# Patient Record
Sex: Female | Born: 1935 | Race: White | Hispanic: No | Marital: Married | Smoking: Never smoker
Health system: Southern US, Community
[De-identification: ages and names within clinical notes are randomized; demographics above are authoritative.]

## PROBLEM LIST (undated history)

## (undated) DIAGNOSIS — I4891 Unspecified atrial fibrillation: Secondary | ICD-10-CM

## (undated) DIAGNOSIS — E785 Hyperlipidemia, unspecified: Secondary | ICD-10-CM

## (undated) DIAGNOSIS — I10 Essential (primary) hypertension: Secondary | ICD-10-CM

## (undated) DIAGNOSIS — R42 Dizziness and giddiness: Secondary | ICD-10-CM

## (undated) DIAGNOSIS — F0390 Unspecified dementia without behavioral disturbance: Secondary | ICD-10-CM

## (undated) DIAGNOSIS — T7840XA Allergy, unspecified, initial encounter: Secondary | ICD-10-CM

## (undated) DIAGNOSIS — R413 Other amnesia: Secondary | ICD-10-CM

## (undated) HISTORY — DX: Other amnesia: R41.3

## (undated) HISTORY — DX: Allergy, unspecified, initial encounter: T78.40XA

## (undated) HISTORY — PX: RETINAL DETACHMENT SURGERY: SHX105

## (undated) HISTORY — DX: Unspecified atrial fibrillation: I48.91

## (undated) HISTORY — DX: Essential (primary) hypertension: I10

## (undated) HISTORY — DX: Hyperlipidemia, unspecified: E78.5

---

## 1972-09-26 HISTORY — PX: APPENDECTOMY: SHX54

## 2000-04-24 ENCOUNTER — Emergency Department (HOSPITAL_COMMUNITY): Admission: EM | Admit: 2000-04-24 | Discharge: 2000-04-24 | Payer: Self-pay | Admitting: Emergency Medicine

## 2000-04-25 ENCOUNTER — Ambulatory Visit (HOSPITAL_COMMUNITY): Admission: RE | Admit: 2000-04-25 | Discharge: 2000-04-25 | Payer: Self-pay | Admitting: Emergency Medicine

## 2001-07-19 ENCOUNTER — Other Ambulatory Visit: Admission: RE | Admit: 2001-07-19 | Discharge: 2001-07-19 | Payer: Self-pay | Admitting: Gynecology

## 2002-12-26 ENCOUNTER — Encounter (INDEPENDENT_AMBULATORY_CARE_PROVIDER_SITE_OTHER): Payer: Self-pay | Admitting: *Deleted

## 2005-08-15 ENCOUNTER — Other Ambulatory Visit: Admission: RE | Admit: 2005-08-15 | Discharge: 2005-08-15 | Payer: Self-pay | Admitting: Gynecology

## 2005-09-06 ENCOUNTER — Encounter (INDEPENDENT_AMBULATORY_CARE_PROVIDER_SITE_OTHER): Payer: Self-pay | Admitting: Specialist

## 2005-09-06 ENCOUNTER — Ambulatory Visit (HOSPITAL_COMMUNITY): Admission: RE | Admit: 2005-09-06 | Discharge: 2005-09-06 | Payer: Self-pay | Admitting: Orthopedic Surgery

## 2005-09-06 ENCOUNTER — Ambulatory Visit (HOSPITAL_BASED_OUTPATIENT_CLINIC_OR_DEPARTMENT_OTHER): Admission: RE | Admit: 2005-09-06 | Discharge: 2005-09-06 | Payer: Self-pay | Admitting: Orthopedic Surgery

## 2009-10-03 ENCOUNTER — Emergency Department (HOSPITAL_COMMUNITY): Admission: EM | Admit: 2009-10-03 | Discharge: 2009-10-03 | Payer: Self-pay | Admitting: Emergency Medicine

## 2009-10-05 ENCOUNTER — Telehealth: Payer: Self-pay | Admitting: Physician Assistant

## 2009-10-07 ENCOUNTER — Ambulatory Visit: Payer: Self-pay | Admitting: Physician Assistant

## 2009-10-07 ENCOUNTER — Ambulatory Visit: Payer: Self-pay | Admitting: Internal Medicine

## 2009-10-07 ENCOUNTER — Encounter: Payer: Self-pay | Admitting: Gastroenterology

## 2009-10-07 DIAGNOSIS — K5730 Diverticulosis of large intestine without perforation or abscess without bleeding: Secondary | ICD-10-CM | POA: Insufficient documentation

## 2009-10-07 DIAGNOSIS — K5732 Diverticulitis of large intestine without perforation or abscess without bleeding: Secondary | ICD-10-CM | POA: Insufficient documentation

## 2009-10-07 DIAGNOSIS — R197 Diarrhea, unspecified: Secondary | ICD-10-CM | POA: Insufficient documentation

## 2009-10-07 DIAGNOSIS — I10 Essential (primary) hypertension: Secondary | ICD-10-CM | POA: Insufficient documentation

## 2009-10-07 DIAGNOSIS — K625 Hemorrhage of anus and rectum: Secondary | ICD-10-CM | POA: Insufficient documentation

## 2009-10-07 DIAGNOSIS — K219 Gastro-esophageal reflux disease without esophagitis: Secondary | ICD-10-CM | POA: Insufficient documentation

## 2009-10-07 DIAGNOSIS — A09 Infectious gastroenteritis and colitis, unspecified: Secondary | ICD-10-CM | POA: Insufficient documentation

## 2009-10-07 DIAGNOSIS — E785 Hyperlipidemia, unspecified: Secondary | ICD-10-CM | POA: Insufficient documentation

## 2009-10-07 DIAGNOSIS — R110 Nausea: Secondary | ICD-10-CM | POA: Insufficient documentation

## 2009-10-07 LAB — CONVERTED CEMR LAB
Basophils Relative: 0.5 % (ref 0.0–3.0)
Eosinophils Absolute: 0.3 10*3/uL (ref 0.0–0.7)
Eosinophils Relative: 4.7 % (ref 0.0–5.0)
Lymphocytes Relative: 36.5 % (ref 12.0–46.0)
MCV: 90 fL (ref 78.0–100.0)
Monocytes Absolute: 0.3 10*3/uL (ref 0.1–1.0)
Monocytes Relative: 6 % (ref 3.0–12.0)
Neutro Abs: 3 10*3/uL (ref 1.4–7.7)
RBC: 4.95 M/uL (ref 3.87–5.11)

## 2009-11-04 ENCOUNTER — Ambulatory Visit: Payer: Self-pay | Admitting: Gastroenterology

## 2009-11-06 ENCOUNTER — Encounter: Payer: Self-pay | Admitting: Gastroenterology

## 2010-04-29 ENCOUNTER — Encounter: Admission: RE | Admit: 2010-04-29 | Discharge: 2010-04-29 | Payer: Self-pay | Admitting: Gynecology

## 2010-09-26 HISTORY — PX: CHOLECYSTECTOMY: SHX55

## 2010-10-28 NOTE — Letter (Signed)
Summary: [facility] Instructions  [facility]  [address] [place], [place] [address]   Phone: [phone]  Fax: [phone]       Savannah Hinton    [birth year]    MRN: [id number]       Procedure Day /Date:11-04-09     Arrival Time: 8:00 AM     Procedure Time:_9:00 AM     Location of Procedure:                    X     [facility] (4th Floor)    PREPARATION FOR COLONOSCOPY WITH MIRALAX  Starting 5 days prior to your procedure 10-30-09 do not eat nuts, seeds, popcorn, corn, beans, peas,  salads, or any raw vegetables.  Do not take any fiber supplements (e.g. Metamucil, Citrucel, and Benefiber). ____________________________________________________________________________________________________   THE DAY BEFORE YOUR PROCEDURE         DATE: 11-03-09 DAY: Tuesday  1   Drink clear liquids the entire day-NO SOLID FOOD  2   Do not drink anything colored red or purple.  Avoid juices with pulp.  No orange juice.  3   Drink at least 64 oz. (8 glasses) of fluid/clear liquids during the day to prevent dehydration and help the prep work efficiently.  CLEAR LIQUIDS INCLUDE: Water Jello Ice Popsicles Tea (sugar ok, no milk/cream) Powdered fruit flavored drinks Coffee (sugar ok, no milk/cream) Gatorade Juice: apple, white grape, white cranberry  Lemonade Clear bullion, consomm, broth Carbonated beverages (any kind) Strained chicken noodle soup Hard Candy  4   Mix the entire bottle of Miralax with 64 oz. of Gatorade/Powerade in the morning and put in the refrigerator to chill.  5   At 3:00 pm take 2 Dulcolax/Bisacodyl tablets.  6   At 4:30 pm take one Reglan/Metoclopramide tablet.  7  Starting at 5:00 pm drink one 8 oz glass of the Miralax mixture every 15-20 minutes until you have finished drinking the entire 64 oz.  You should finish drinking prep around 7:30 or 8:00 pm.  8   If you are nauseated, you may take the 2nd Reglan/Metoclopramide tablet at 6:30  pm.        9    At 8:00 pm take 2 more DULCOLAX/Bisacodyl tablets.     THE DAY OF YOUR PROCEDURE      DATE: 11-04-09 DAY: Wednesday  You may drink clear liquids until 7:00 AM  (2 HOURS BEFORE PROCEDURE).   MEDICATION INSTRUCTIONS  Unless otherwise instructed, you should take regular prescription medications with a small sip of water as early as possible the morning of your procedure.         OTHER INSTRUCTIONS  You will need a responsible adult at least 74 years of age to accompany you and drive you home.   This person must remain in the waiting room during your procedure.  Wear loose fitting clothing that is easily removed.  Leave jewelry and other valuables at home.  However, you may wish to bring a book to read or an iPod/MP3 player to listen to music as you wait for your procedure to start.  Remove all body piercing jewelry and leave at home.  Total time from sign-in until discharge is approximately 2-3 hours.  You should go home directly after your procedure and rest.  You can resume normal activities the day after your procedure.  The day of your procedure you should not:   Drive   Make legal decisions  Operate machinery   Drink alcohol   Return to work  You will receive specific instructions about eating, activities and medications before you leave.   The above instructions have been reviewed and explained to me by   _______________________    I fully understand and can verbalize these instructions _____________________________ Date _______

## 2010-10-28 NOTE — Assessment & Plan Note (Signed)
Summary: [facility] ,bloody diarrhea/PP   History of Present Illness Visit Type: new patient  Primary GI MD: Stan Head MD [facility] Primary Provider: Marinda Elk, MD  Requesting Provider: n/a Chief Complaint: Hosp f/u for BRB in stools. Pt states that she is better and there is no more blood in stool. History of Present Illness:   76 YO FEMALE KNOWN TO DR SAM [place] ,LAST SEEN IN 2004 FOR COLONOSCOPY WHICH SHOWED DIVERTICULOSIS/NO POLYPS. SHE WAS SEEN THIS PAST WEEKEND BY MYSELF IN THE E.R. AT [facility]. SHE PRESENTED WITH ONE DAY HX OF DIARRHEA,SEVERE ABDOMINAL CRAMPING ,NAUSEA AND MULTIPLE DIARRHEAL STOOLS. SHE NOTED BRB MIXED WITH THE BMS. NO FEVER.LABS IN THE ER WERE NORMAL. ON EXAM SHE WAS NONTOXIS AND STOOL SPECIMEN SHE BROUGHT WITH HER WAS LIQUID BROWN TINGED WITH BRB. SHE IS MUCH IMPROVED AT THIS TIME. NO FURTHER BLEEDING. ABDOMINALCRAMPING HAS RESOLVED,SHE IS EATING WITHOUT PAIN OR NAUSEA. SHE REPORTS HER HUSBAND IS SICK TODAY WITH DIARRHEA AND CRAMPING.   GI Review of Systems    Reports acid reflux, nausea, and  vomiting.      Denies abdominal pain, belching, bloating, chest pain, dysphagia with liquids, dysphagia with solids, heartburn, loss of appetite, vomiting blood, and  weight loss.      Reports diarrhea, diverticulosis, and  rectal bleeding.     Denies anal fissure, black tarry stools, change in bowel habit, constipation, heme positive stool, hemorrhoids, irritable bowel syndrome, jaundice, light color stool, liver problems, and  rectal pain.    Current Medications (verified): 1)  Zestoretic 20-25 Mg Tabs (Lisinopril-Hydrochlorothiazide) .... One Tablet By Mouth Once Daily 2)  Crestor 10 Mg Tabs (Rosuvastatin Calcium) .... One Tablet By Mouth Once Daily 3)  Diazepam 5 Mg Tabs (Diazepam) .... One Tablet By Mouth Three Times A Day As Needed 4)  Aspirin 81 Mg  Tabs (Aspirin) .... One Tablet By Mouth Once Daily 5)  Nexium 40 Mg Cpdr (Esomeprazole Magnesium) .... One Tablet By  Mouth Once Daily As Needed 6)  Vitamin E 400 Unit Caps (Vitamin E) .... One Tablet By Mouth Once Daily 7)  Centrum Silver  Tabs (Multiple Vitamins-Minerals) .... One Tablet By Mouth Once Daily 8)  Clarinex 5 Mg Tabs (Desloratadine) .... One Tablet By Mouth As Needed 9)  Allergy Shot(Dosage Unknown) .... Once A Week 10)  Tylenol Extra Strength 500 Mg Tabs (Acetaminophen) .... As Needed For Pain 11)  Dicyclomine Hcl 10 Mg Caps (Dicyclomine Hcl) .... One Capsule By Mouth 1/2 Hour Before Each Meal For Cramping  Allergies (verified): 1)  ! Penicillin  Past History:  Past Medical History: Anal Fissure--1979 Glaucoma Hyperlipidemia Hypertension Urinary Tract Infection DIVERTICULOSIS  Past Surgical History: Appendectomy Hysterectomy  Family History: No FH of Colon Cancer: Family History of Breast Cancer:Sister   Social History: Occupation: Retired Married one child Patient has never smoked.  Alcohol Use - no Illicit Drug Use - no Smoking Status:  never Drug Use:  no  Review of Systems       The patient complains of allergy/sinus.  The patient denies anemia, anxiety-new, arthritis/joint pain, back pain, blood in urine, breast changes/lumps, change in vision, confusion, cough, coughing up blood, depression-new, fainting, fatigue, fever, headaches-new, hearing problems, heart murmur, heart rhythm changes, itching, menstrual pain, muscle pains/cramps, night sweats, nosebleeds, pregnancy symptoms, shortness of breath, skin rash, sleeping problems, sore throat, swelling of feet/legs, swollen lymph glands, thirst - excessive , urination - excessive , urination changes/pain, urine leakage, vision changes, and voice change.  ROS OTHERWISE AS IN HPI  Vital Signs:  Patient profile:   76 year old female Height:      65 inches Weight:      202 pounds BMI:     33.74 BSA:     1.99 Pulse rate:   64 / minute Pulse rhythm:   regular BP sitting:   124 / 76  (left arm) Cuff size:    regular  Vitals Entered By: Ok Anis CMA (October 07, 2009 10:28 AM)  Physical Exam  General:  Well developed, well nourished, no acute distress. Head:  Normocephalic and atraumatic. Eyes:  PERRLA, no icterus. Neck:  Supple; no masses or thyromegaly. Lungs:  Clear throughout to auscultation. Heart:  Regular rate and rhythm; no murmurs, rubs,  or bruits. Abdomen:  SOFT, NONTENDER, NO MASS OR HSM,BS+ Rectal:  NOT DONE Extremities:  No clubbing, cyanosis, edema or deformities noted. Neurologic:  Alert and  oriented x4;  grossly normal neurologically. Psych:  Alert and cooperative. Normal mood and affect.   Impression & Recommendations:  Problem # 1:  DIARRHEA-PRESUMED INFECTIOUS (ICD-009.3) Assessment New 76 YO FEMALE WITH RESOLVED INFECTOIUS GASTROENTERITIS  LABS DONE TODAY IN THE OFFICE ;WBC5.7,HGB 14.6.  ADVANCE DIET AS TOLERATED  Problem # 2:  RECTAL BLEEDING (ICD-569.3) Assessment: New  HEMATOCHEZIA ASSOCIATED WITH ABOVE -PROBABLY SECONDARY TO ASSOCIATED COLITIS,HOWEVER LAST COLONOSCOPY WAS IN 2004,CANNOT R/O OTHER SOURCE/LESION.  DISCUSSED FOLLOW UP COLONOSOCPY AND PT WOULD LIKE TO PROCEED, HER HUSBAND SEES DR PATTERSON AND SHE WOULD LIKE TO ESTABLISH WITH HIM. SCHEDULE COLONOSCOPY WITH DR Sandria Bales DISCUSSED IN DETAIL WITH PT.  ADVISED TO CALL IN THE INTERIM IF ANY RECURRENT BLEEDING.  Orders: Colonoscopy (Colon)  Problem # 3:  DIVERTICULOSIS-COLON (ICD-562.10) Assessment: Comment Only  Problem # 4:  HYPERLIPIDEMIA (ICD-272.4) Assessment: Comment Only  Problem # 5:  HYPERTENSION (ICD-401.9) Assessment: Comment Only  Patient Instructions: 1)  We scheduled the colonoscopy with Dr. Jarold Motto for 11-04-09 at 9:00 AM. 2)  We sent the colonoscopy prep to your pharmacy [facility]. 3)  Copy sent to : Dr. Marinda Elk Prescriptions: DULCOLAX 5 MG  TBEC (BISACODYL) Day before procedure take 2 at 3pm and 2 at 8pm.  #4 x 0   Entered by:   Lowry Ram  NCMA   Authorized by:   Sammuel Cooper PA-c   Signed by:   Lowry Ram NCMA on 10/07/2009   Method used:   [facility]ically to        [facility]  [address] [phone][facility] (retail)       [address] [place], [place]  [address]       Ph: [phone] or [phone]       Fax: [phone]   RxID:   [id number] METOCLOPRAMIDE HCL 10 MG  TABS (METOCLOPRAMIDE HCL) As per prep instructions.  #2 x 0   Entered by:   Lowry Ram NCMA   Authorized by:   Sammuel Cooper PA-c   Signed by:   Lowry Ram NCMA on 10/07/2009   Method used:   [facility]        [facility]  [address] [phone][facility] (retail)       [address] [place], [place]  [address]       Ph: [phone] or [phone]       Fax: [phone]   RxID:  [id number] MIRALAX   POWD (POLYETHYLENE GLYCOL 3350) As per prep  instructions.  #255gm x 0   Entered by:   Lowry Ram NCMA   Authorized by:   Sammuel Cooper PA-c   Signed by:   Lowry Ram NCMA on 10/07/2009   Method used:   [facility]        [facility]  [address] [phone][facility] (retail)       [address] [place], [place]  [address]       Ph: [phone] or [phone]       Fax: [phone]   RxID:   [id number]

## 2010-10-28 NOTE — Procedures (Signed)
Summary: [facility] COLON   Colonoscopy  Procedure date:  12/26/2002  Findings:      Location:  [facility].   Patient Name: Savannah Hinton, Savannah Hinton MRN:  Procedure Procedures: Colonoscopy CPT: [phone].  Personnel: Endoscopist: Ulyess Mort, MD.  Referred By: Marinda Elk, MD. Sharon Mt, MD. Vianne Bulls, MD. Miguel Aschoff, MD.  Exam Location: Exam performed in Outpatient Clinic. Outpatient  Patient Consent: Procedure, Alternatives, Risks and Benefits discussed, consent obtained, from patient. Consent was obtained by the RN.  Indications  Average Risk Screening Routine.  History  Pre-Exam Physical: Performed Dec 26, 2002. Cardio-pulmonary exam, Rectal exam, HEENT exam , Abdominal exam, Extremity exam, Mental status exam WNL.  Exam Exam: Extent of exam reached: Cecum, extent intended: Cecum.  The cecum was identified by appendiceal orifice and IC valve. Colon retroflexion performed. Images were not taken. ASA Classification: II. Tolerance: good.  Monitoring: Pulse and BP monitoring, Oximetry used. Supplemental O2 given.  Colon Prep Prep results: good.  Sedation Meds: Patient assessed and found to be appropriate for moderate (conscious) sedation. Fentanyl given IV. Versed given IV.  Findings - DIVERTICULOSIS: Descending Colon to Sigmoid Colon. ICD9: Diverticulosis: 562.10. Comments: mild.  - NOT SEEN ON EXAM: Cecum to Rectum. Polyps, AVM's, Colitis, Tumors, Melanosis, Crohn's, Hemorrhoids,   Assessment Abnormal examination, see findings above.  Diagnoses: 562.10: Diverticulosis.   Events  Unplanned Interventions: No intervention was required.  Unplanned Events: There were no complications. Plans Medication Plan: Continue current medications.  Patient Education: Patient given standard instructions for: Diverticulosis. Yearly hemoccult testing recommended. Patient instructed to get routine colonoscopy every 8 years.  Disposition: After  procedure patient sent to recovery. After recovery patient sent home.   cc: Marinda Elk, MD     Sharon Mt, MD     C. Duane Lope, MD     Duane Lope, MD  This report was created from the original endoscopy report, which was reviewed and signed by the above listed endoscopist.

## 2010-10-28 NOTE — Procedures (Signed)
Summary: Colonoscopy  Patient: [name] Note: All result statuses are Final unless otherwise noted.  Tests: (1) Colonoscopy (COL)   COL Colonoscopy           DONE     [facility]     [address]     [place], [place]  [address]           COLONOSCOPY PROCEDURE REPORT           PATIENT:  Savannah Hinton, Savannah Hinton  MR#:  [id number]     BIRTHDATE:  06-29-1935, 74 yrs. old  GENDER:  female           ENDOSCOPIST:  Vania Rea. Jarold Motto, MD, [facility]     Referred by:           PROCEDURE DATE:  11/04/2009     PROCEDURE:  Colonoscopy with snare polypectomy     ASA CLASS:  Class II     INDICATIONS:  rectal bleeding           MEDICATIONS:   Fentanyl 75 mcg IV, Versed 8 mg IV           DESCRIPTION OF PROCEDURE:   After the risks benefits and     alternatives of the procedure were thoroughly explained, informed     consent was obtained.  Digital rectal exam was performed and     revealed no abnormalities.   The LB CF-[id number] endoscope     was introduced through the anus and advanced to the cecum, which     was identified by both the appendix and ileocecal valve, without     limitations.  The quality of the prep was adequate, using     MoviPrep.  The instrument was then slowly withdrawn as the colon     was fully examined.     <<PROCEDUREIMAGES>>           FINDINGS:  Moderate diverticulosis was found sigmoid to descending     A pedunculated polyp was found. 1.5CM THICK STALKED VASCULAR POLYP     SNARE EXCISED.  This was otherwise a normal examination of the     colon.   Retroflexed views in the rectum revealed no     abnormalities.    The scope was then withdrawn from the patient     and the procedure completed.           COMPLICATIONS:  None           ENDOSCOPIC IMPRESSION:     1) Moderate diverticulosis in the sigmoid to descending     2) Pedunculated polyp     3) Otherwise normal examination     NO COLITIS NOTED.     RECOMMENDATIONS:     1) high fiber diet     2) If  the polyp(s) removed today are proven to be adenomatous     (pre-cancerous) polyps, you will need a repeat colonoscopy in 5     years. Otherwise you should continue to follow colorectal cancer     screening guidelines for "routine risk" patients with colonoscopy     in 10 years.           REPEAT EXAM:  No           ______________________________     Vania Rea. Jarold Motto, MD, Clementeen Graham           CC:  Marinda Elk, MDEsterwood, Amy PA-C           n.  eSIGNED:   Vania Rea. Delmar Arriaga at 11/04/2009 09:32 AM           Pecolia Ades, [id number]  Note: An exclamation mark (!) indicates a result that was not dispersed into the flowsheet. Document Creation Date: 11/04/2009 10:18 AM _______________________________________________________________________  (1) Order result status: Final Collection or observation date-time: 11/04/2009 09:26 Requested date-time:  Receipt date-time:  Reported date-time:  Referring Physician:   Ordering Physician: Sheryn Bison [phone]) Specimen Source:  Source: Launa Grill Order Number: [id number] Lab site:   Appended Document: Colonoscopy     Procedures Next Due Date:    Colonoscopy: 10/2014

## 2010-10-28 NOTE — Letter (Signed)
Summary: Patient Notice- Polyp Results  [facility]  [address] [place], [place] [address]   Phone: [phone]  Fax: [phone]        November 06, 2009 MRN: [id number]    Savannah Hinton [address] [place], [place]  [address]    Dear [name],  I am pleased to inform you that the colon polyp(s) removed during your recent colonoscopy was (were) found to be benign (no cancer detected) upon pathologic examination.  I recommend you have a repeat colonoscopy examination in 5_ years to look for recurrent polyps, as having colon polyps increases your risk for having recurrent polyps or even colon cancer in the future.  Should you develop new or worsening symptoms of abdominal pain, bowel habit changes or bleeding from the rectum or bowels, please schedule an evaluation with either your primary care physician or with me.  Additional information/recommendations:  _X_ No further action with gastroenterology is needed at this time. Please      follow-up with your primary care physician for your other healthcare      needs.  __ Please call [phone] to schedule a return visit to review your      situation.  __ Please keep your follow-up visit as already scheduled.  __ Continue treatment plan as outlined the day of your exam.  Please call us if you are having persistent problems or have questions about your condition that have not been fully answered at this time.  Sincerely,  Mardella Layman MD [facility]  This letter has been electronically signed by your physician.  Appended Document: Patient Notice- Polyp Results Letter mailed 2.15.11

## 2010-10-28 NOTE — Progress Notes (Signed)
Summary: fyi  Phone Note Call from Patient Call back at Home Phone [phone]   Caller: Patient Call For: Savannah Hinton/LEABUER Reason for Call: Talk to Nurse Summary of Call: Patient was seen by Sulay Brymer on Saturday and was told to call us today to let her know how she was doing, pt states that she feels better but wants to know about f.u appts. Initial call taken by: Savannah Hinton,  October 05, 2009 8:47 AM  Follow-up for Phone Call        Spoke to pt and she is feeling better.  She has seen no blood since the hospital in her stools.  She has no abd pain.  I made her appt to have a CBC in [facility].  Pt will come to the lab Wed 10-07-09 and then come up stairs to our office for appt with Barton Want PA.  Her appt is for 11AM but should be here at 10:45 to fill out new paperwork.  Hasn't seen anyone here since Dr. Victorino Dike. Follow-up by: Savannah Hinton,  October 05, 2009 9:00 AM

## 2010-12-12 LAB — DIFFERENTIAL
Basophils Relative: 0 % (ref 0–1)
Eosinophils Absolute: 0.1 10*3/uL (ref 0.0–0.7)
Eosinophils Relative: 1 % (ref 0–5)
Monocytes Relative: 5 % (ref 3–12)
Neutro Abs: 11.4 10*3/uL — ABNORMAL HIGH (ref 1.7–7.7)

## 2010-12-12 LAB — COMPREHENSIVE METABOLIC PANEL
ALT: 17 U/L (ref 0–35)
Albumin: 4.2 g/dL (ref 3.5–5.2)
Alkaline Phosphatase: 65 U/L (ref 39–117)
Chloride: 104 mEq/L (ref 96–112)
Creatinine, Ser: 0.9 mg/dL (ref 0.4–1.2)
GFR calc non Af Amer: >60 mL/min (ref >60)
Potassium: 4 mEq/L (ref 3.5–5.1)
Total Protein: 7.5 g/dL (ref 6.0–8.3)

## 2010-12-12 LAB — APTT: aPTT: 25 seconds (ref 24–37)

## 2010-12-12 LAB — CBC
HCT: 45.3 % (ref 36.0–46.0)
MCHC: 33.3 g/dL (ref 30.0–36.0)
MCV: 88.9 fL (ref 78.0–100.0)
RDW: 13.3 % (ref 11.5–15.5)

## 2010-12-12 LAB — PROTIME-INR: Prothrombin Time: 13.2 seconds (ref 11.6–15.2)

## 2011-02-11 NOTE — Op Note (Signed)
NAMESABRYNA, Savannah Hinton             ACCOUNT NO.:  [id number]   MEDICAL RECORD NO.:  [id number]          PATIENT TYPE:  AMB   LOCATION:  DSC                          FACILITY:  [facility]   PHYSICIAN:  Katy Fitch. Sypher, M.D. DATE OF BIRTH:  Sep 07, 1935   DATE OF PROCEDURE:  09/06/2005  DATE OF DISCHARGE:                                 OPERATIVE REPORT   PREOPERATIVE DIAGNOSIS:  Enlarging mass, palmar surface of right small  finger middle phalangeal segment.   POSTOPERATIVE DIAGNOSIS:  Early Dupuytren's palmar fibromatosis with  extensive cord and nodule formation.   OPERATIONS:  Right small finger fasciectomy.   OPERATING SURGEON:  Katy Fitch. Sypher, M.D.   ASSISTANT:  Molly Maduro Dasnoit PA-C   ANESTHESIA:  2% lidocaine metacarpal head level-block of right small finger  supplemented by IV sedation, supervising anesthesiologist Dr. Ivin Booty.   INDICATIONS:  Savannah Hinton is a 76 year old woman referred through the  courtesy of Dr. Foy Guadalajara of [facility] for evaluation of a mass in the palmar  surface of her right small finger.   She had a history of a nodule developing over the P2 segment that was  painful when she would grasp firm objects.  She had no clinical contracture.   This continued to enlarge, causing alarm.   She brought this the attention of Dr. Foy Guadalajara, who subsequently referred her  for an upper extremity orthopedic consult.   After informed consent, she is brought to the operating room at this time.   PROCEDURE:  Savannah Hinton is brought to the operating room and placed  in the supine position on the operating table.   Following light sedation, the right arm was prepped with Betadine soap and  solution and sterilely draped.  A pneumatic tourniquet was applied to the  proximal right brachium.   Following exsanguination of the right arm with an Esmarch bandage, an  arterial tourniquet was inflated to 250 mmHg.   Lidocaine 2% was infiltrated to obtain a digital block at  the metacarpal  head level.   When anesthesia was satisfactory, a Brunner zigzag incision was fashioned  from the DIP flexion crease to the proximal finger flexion crease.  The  subcutaneous tissues were carefully divided, revealing what appeared to be  obvious Dupuytren's nodule overlying the A3 and C1 pulleys.  There were  significant proximal extensions with a retrovascular cord on the ulnar  aspect of the finger and involvement of the lateral fascial sheath in the  ulnar aspect of the finger and several pretendinous cords on the radial  aspect of the finger.   A meticulous resection of all pathologic fascia was accomplished and the  nodule was removed with circumferential dissection, while carefully  retracting the neurovascular bundles.   Bleeding points were electrocauterized with bipolar current, followed by  repair of the skin with interrupted sutures of 5-0 nylon.   A compressive dressing was applied with Xeroflo sterile gauze and Coban.   For aftercare Ms. Broers was advised to elevate her fingers.  She work on  early range of motion excises.  She will return to see [place] for follow-up  in  office in five to seven days.   She is provided a prescription for Darvocet-N 100 one p.o. q.4-6h. p.r.n.  pain, 20 tablets with no refills.      Katy Fitch Sypher, M.D.  Electronically Signed     RVS/MEDQ  D:  09/06/2005  T:  09/07/2005  Job:  [id number]   cc:   Molly Maduro L. Foy Guadalajara, M.D.  Fax: [phone]

## 2011-06-02 ENCOUNTER — Encounter (INDEPENDENT_AMBULATORY_CARE_PROVIDER_SITE_OTHER): Payer: Self-pay | Admitting: Surgery

## 2011-06-03 ENCOUNTER — Encounter (INDEPENDENT_AMBULATORY_CARE_PROVIDER_SITE_OTHER): Payer: Self-pay | Admitting: Surgery

## 2011-06-16 ENCOUNTER — Encounter (INDEPENDENT_AMBULATORY_CARE_PROVIDER_SITE_OTHER): Payer: Self-pay | Admitting: Surgery

## 2011-06-16 ENCOUNTER — Ambulatory Visit (INDEPENDENT_AMBULATORY_CARE_PROVIDER_SITE_OTHER): Payer: Medicare Other | Admitting: Surgery

## 2011-06-16 VITALS — BP 116/86 | HR 80 | Temp 98.2°F | Resp 20 | Ht 64.0 in | Wt 202.2 lb

## 2011-06-16 DIAGNOSIS — K8010 Calculus of gallbladder with chronic cholecystitis without obstruction: Secondary | ICD-10-CM | POA: Insufficient documentation

## 2011-06-16 NOTE — Progress Notes (Signed)
Chief Complaint  Patient presents with  . Cholelithiasis    HPI Savannah Hinton is a 76 y.o. female Who presents with several years of intermittent postprandial cramping and diarrhea. She has had some nausea but she has never been able to vomit. She has some right-sided abdominal discomfort associated with the symptoms. This does happen when eating greasy food. She brought this to the attention of Dr. Joni Reining who obtained a right upper quadrant abdominal ultrasound. This showed a 2.3 cm gallstone but no wall thickening. Common bile duct was normal.HPI  Past Medical History  Diagnosis Date  . Hypertension   . Allergy   . Hyperlipidemia   . Glaucoma     Past Surgical History  Procedure Date  . Retinal detachment surgery   . Appendectomy 1974    Family History  Problem Relation Age of Onset  . Cancer Sister     breast    Social History History  Substance Use Topics  . Smoking status: Never Smoker   . Smokeless tobacco: Never Used  . Alcohol Use: No    Allergies  Allergen Reactions  . Penicillins     Current Outpatient Prescriptions  Medication Sig Dispense Refill  . aspirin 81 MG tablet Take 81 mg by mouth daily.        . calcium carbonate (TUMS - DOSED IN MG ELEMENTAL CALCIUM) 500 MG chewable tablet Chew 1 tablet by mouth daily.        Marland Kitchen desloratadine (CLARINEX) 5 MG tablet Take 5 mg by mouth daily.        . diazepam (VALIUM) 5 MG tablet Take 5 mg by mouth every 6 (six) hours as needed.        Marland Kitchen lisinopril-hydrochlorothiazide (PRINZIDE,ZESTORETIC) 20-12.5 MG per tablet Take 1 tablet by mouth daily.        . timolol (BETIMOL) 0.5 % ophthalmic solution 1 drop 2 (two) times daily.        . Vitamin D, Ergocalciferol, (DRISDOL) 50000 UNITS CAPS Take 50,000 Units by mouth.        . vitamin E 400 UNIT capsule Take 400 Units by mouth daily.        . multivitamin-iron-minerals-folic acid (CENTRUM) chewable tablet Chew 1 tablet by mouth daily.        . rosuvastatin  (CRESTOR) 10 MG tablet Take 10 mg by mouth daily.          Review of Systems Review of Systems Positive for diarrhea, abdominal cramping, nausea Blood pressure 116/86, pulse 80, temperature 98.2 F (36.8 C), temperature source Temporal, resp. rate 20, height 5\' 4"  (1.626 m), weight 202 lb 3.2 oz (91.717 kg).  Physical Exam Physical Exam WDWN in NAD HEENT:  EOMI, sclera anicteric Neck:  No masses, no thyromegaly Lungs:  CTA bilaterally; normal respiratory effort CV:  Regular rate and rhythm; no murmurs Abd:  +bowel sounds, soft, mild RUQ tenderness; small healed burn above umbilicus Ext:  Well-perfused; no edema Skin:  Warm, dry; no sign of jaundice Data Reviewed RUQ U/S  Assessment    Chronic calculus cholecystitis    Plan    Laparoscopic cholecystectomy with intraoperative cholangiogram - I discussed the procedure in detail.  The patient was given Agricultural engineer.  We discussed the risks and benefits of a laparoscopic cholecystectomy including, but not limited to bleeding, infection, injury to surrounding structures such as the intestine or liver, bile leak, retained gallstones, need to convert to an open procedure, prolonged diarrhea, blood clots such as  DVT,  common bile duct injury, anesthesia risks, and possible need for additional procedures.  We discussed the typical post-operative recovery course.        Tameka Hoiland K. 06/16/2011, 10:31 AM

## 2011-06-16 NOTE — Patient Instructions (Signed)
We will schedule your surgery today. 

## 2011-07-06 ENCOUNTER — Other Ambulatory Visit (INDEPENDENT_AMBULATORY_CARE_PROVIDER_SITE_OTHER): Payer: Self-pay | Admitting: Surgery

## 2011-07-06 ENCOUNTER — Encounter (HOSPITAL_COMMUNITY): Payer: Medicare Other

## 2011-07-06 ENCOUNTER — Ambulatory Visit (HOSPITAL_COMMUNITY)
Admission: RE | Admit: 2011-07-06 | Discharge: 2011-07-06 | Disposition: A | Discharge status: Home / Self Care | Payer: Medicare Other | Source: Ambulatory Visit | Attending: Surgery | Admitting: Surgery

## 2011-07-06 DIAGNOSIS — Z01812 Encounter for preprocedural laboratory examination: Secondary | ICD-10-CM | POA: Insufficient documentation

## 2011-07-06 DIAGNOSIS — Z01810 Encounter for preprocedural cardiovascular examination: Secondary | ICD-10-CM | POA: Insufficient documentation

## 2011-07-06 DIAGNOSIS — E785 Hyperlipidemia, unspecified: Secondary | ICD-10-CM | POA: Insufficient documentation

## 2011-07-06 DIAGNOSIS — K811 Chronic cholecystitis: Secondary | ICD-10-CM

## 2011-07-06 DIAGNOSIS — Z01818 Encounter for other preprocedural examination: Secondary | ICD-10-CM | POA: Insufficient documentation

## 2011-07-06 DIAGNOSIS — I10 Essential (primary) hypertension: Secondary | ICD-10-CM | POA: Insufficient documentation

## 2011-07-06 LAB — CBC
MCHC: 33.3 g/dL (ref 30.0–36.0)
MCV: 87.9 fL (ref 78.0–100.0)
RBC: 4.88 MIL/uL (ref 3.87–5.11)
WBC: 7.1 10*3/uL (ref 4.0–10.5)

## 2011-07-06 LAB — BASIC METABOLIC PANEL
BUN: 19 mg/dL (ref 6–23)
Calcium: 9.5 mg/dL (ref 8.4–10.5)
Glucose, Bld: 93 mg/dL (ref 70–99)

## 2011-07-06 LAB — SURGICAL PCR SCREEN: Staphylococcus aureus: NEGATIVE

## 2011-07-06 NOTE — Progress Notes (Signed)
Quick Note:  This patient may proceed with surgery. All labs and CXR are OK for surgery ______

## 2011-07-12 ENCOUNTER — Other Ambulatory Visit (INDEPENDENT_AMBULATORY_CARE_PROVIDER_SITE_OTHER): Payer: Self-pay | Admitting: Surgery

## 2011-07-12 ENCOUNTER — Ambulatory Visit (HOSPITAL_COMMUNITY): Payer: Medicare Other

## 2011-07-12 ENCOUNTER — Ambulatory Visit (HOSPITAL_COMMUNITY)
Admission: RE | Admit: 2011-07-12 | Discharge: 2011-07-12 | Disposition: A | Discharge status: Home / Self Care | Payer: Medicare Other | Source: Ambulatory Visit | Attending: Surgery | Admitting: Surgery

## 2011-07-12 DIAGNOSIS — Z01810 Encounter for preprocedural cardiovascular examination: Secondary | ICD-10-CM | POA: Insufficient documentation

## 2011-07-12 DIAGNOSIS — K8010 Calculus of gallbladder with chronic cholecystitis without obstruction: Secondary | ICD-10-CM | POA: Insufficient documentation

## 2011-07-12 DIAGNOSIS — E669 Obesity, unspecified: Secondary | ICD-10-CM | POA: Insufficient documentation

## 2011-07-12 DIAGNOSIS — Z01812 Encounter for preprocedural laboratory examination: Secondary | ICD-10-CM | POA: Insufficient documentation

## 2011-07-12 DIAGNOSIS — Z01818 Encounter for other preprocedural examination: Secondary | ICD-10-CM | POA: Insufficient documentation

## 2011-07-12 DIAGNOSIS — I10 Essential (primary) hypertension: Secondary | ICD-10-CM | POA: Insufficient documentation

## 2011-07-13 NOTE — Op Note (Signed)
Savannah Hinton, DOMBEK NO.:  [id number]  MEDICAL RECORD NO.:  [id number]  LOCATION:  DAYL                         FACILITY:  [facility]  PHYSICIAN:  Wilmon Arms. Corliss Skains, M.D. DATE OF BIRTH:  [birth year]  DATE OF PROCEDURE:  07/12/2011 DATE OF DISCHARGE:                              OPERATIVE REPORT   PREOPERATIVE DIAGNOSIS:  Chronic calculus cholecystitis.  POSTOPERATIVE DIAGNOSIS:  Chronic calculus cholecystitis.  PROCEDURE:  Laparoscopic cholecystectomy with intraoperative cholangiogram.  SURGEON:  Wilmon Arms. Menelik Mcfarren, M.D.  ANESTHESIA:  General.  INDICATIONS:  This is a 76 year old female who presents with several years of intermittent postprandial abdominal cramping and diarrhea.  She has had some nausea, but no vomiting.  Ultrasound showed a 2.3 cm gallstone, but no sign of wall thickening.  Her liver function tests were normal.  She presents now for cholecystectomy.  DESCRIPTION OF PROCEDURE:  The patient was brought to the operating room and placed in a supine position on the operating table.  After adequate level of general anesthesia was obtained, the patient's abdomen was prepped with ChloraPrep and draped in sterile fashion.  Time-out was taken to ensure the proper patient and proper procedure.  We infiltrated the area below the umbilicus with 0.25% Marcaine with epinephrine.  A transverse incision was made.  Dissection was carried down the fascia. The fascia was incised vertically.  We entered the peritoneal cavity bluntly.  A stay suture of 0 Vicryl was placed around the fascial opening.  The Hasson cannula was inserted and secured to stay suture. Pneumoperitoneum was obtained by insufflating CO2 maintaining maximal pressure of 15 mmHg.  The laparoscope was inserted.  The patient was positioned in reverse Trendelenburg, tilted to her left.  An 11 mm port was placed in the subxiphoid position.  Two 5 mm ports were placed in the right upper quadrant.   The gallbladder was grasped with clamp and lifted.  There was some chronic thickening to the gallbladder, but no active inflammation.  We opened the peritoneum around the hilum of the gallbladder, dissected around the cystic duct and cystic artery.  We ligated the cystic duct with a clip distally.  A small opening was created on the cystic duct.  A Cook cholangiogram catheter was inserted through a stab incision, threaded in the cystic duct.  A cholangiogram was then obtained, which showed good flow proximally and distally in the biliary tree with no sign of filling defect.  Contrast flowed easily into the duodenum.  The catheter was removed and the cystic duct was ligated with clips and divided.  The cystic artery was ligated with clips and divided.  Cautery was then used to dissect the gallbladder free from the liver.  The gallbladder was placed in an Endocatch sac. We irrigated the gallbladder fossa thoroughly for hemostasis.  We then removed the gallbladder and Endocatch sac through the umbilical port site.  A pursestring suture was used to close the umbilical fascia.  We inspected again for hemostasis. Pneumoperitoneum was then released as we removed the trocars.  4-0 Monocryl was used to close the skin incisions.  Steri-Strips and clean dressings were applied.  All sponge, instrument, needle counts were correct.  The patient was  extubated and brought to the recovery room in stable condition.     Wilmon Arms. Corliss Skains, M.D.     MKT/MEDQ  D:  07/12/2011  T:  07/12/2011  Job:  [id number]  Electronically Signed by Manus Rudd M.D. on 07/13/2011 10:27:38 AM

## 2011-07-15 ENCOUNTER — Encounter (INDEPENDENT_AMBULATORY_CARE_PROVIDER_SITE_OTHER): Payer: Self-pay | Admitting: Surgery

## 2011-07-21 ENCOUNTER — Encounter (INDEPENDENT_AMBULATORY_CARE_PROVIDER_SITE_OTHER): Payer: Self-pay | Admitting: Surgery

## 2011-07-27 ENCOUNTER — Encounter (INDEPENDENT_AMBULATORY_CARE_PROVIDER_SITE_OTHER): Payer: Self-pay | Admitting: Surgery

## 2011-07-27 ENCOUNTER — Ambulatory Visit (INDEPENDENT_AMBULATORY_CARE_PROVIDER_SITE_OTHER): Payer: Medicare Other | Admitting: Surgery

## 2011-07-27 VITALS — BP 125/79 | HR 72 | Temp 97.2°F | Resp 15 | Ht 64.0 in | Wt 194.2 lb

## 2011-07-27 DIAGNOSIS — K8010 Calculus of gallbladder with chronic cholecystitis without obstruction: Secondary | ICD-10-CM

## 2011-07-27 NOTE — Patient Instructions (Signed)
You may resume full activity.  Follow-up as needed.

## 2011-07-27 NOTE — Progress Notes (Signed)
Status post laparoscopic cholecystectomy with intraoperative cholangiogram on 10/16. Her pathology showed chronic calculus cholecystitis. She is doing quite well. She has resumed a regular diet. Bowel movements are softer but she is not having diarrhea. Her incisions are healing well with no sign of infection. No abdominal pain. She never take any narcotic pain medication. She'll use Tylenol. She may resume full activity. Followup p.r.n.

## 2011-12-26 ENCOUNTER — Ambulatory Visit (HOSPITAL_BASED_OUTPATIENT_CLINIC_OR_DEPARTMENT_OTHER)
Admission: RE | Admit: 2011-12-26 | Discharge: 2011-12-26 | Disposition: A | Discharge status: Home / Self Care | Payer: Medicare Other | Source: Ambulatory Visit | Attending: Family Medicine | Admitting: Family Medicine

## 2011-12-26 ENCOUNTER — Encounter (HOSPITAL_BASED_OUTPATIENT_CLINIC_OR_DEPARTMENT_OTHER): Payer: Self-pay

## 2011-12-26 ENCOUNTER — Other Ambulatory Visit (HOSPITAL_BASED_OUTPATIENT_CLINIC_OR_DEPARTMENT_OTHER): Payer: Self-pay | Admitting: Family Medicine

## 2011-12-26 ENCOUNTER — Emergency Department (HOSPITAL_BASED_OUTPATIENT_CLINIC_OR_DEPARTMENT_OTHER)
Admission: EM | Admit: 2011-12-26 | Discharge: 2011-12-26 | Disposition: A | Discharge status: Home / Self Care | Payer: Medicare Other | Attending: Emergency Medicine | Admitting: Emergency Medicine

## 2011-12-26 DIAGNOSIS — R52 Pain, unspecified: Secondary | ICD-10-CM

## 2011-12-26 DIAGNOSIS — M79609 Pain in unspecified limb: Secondary | ICD-10-CM

## 2011-12-26 DIAGNOSIS — R609 Edema, unspecified: Secondary | ICD-10-CM | POA: Insufficient documentation

## 2011-12-26 DIAGNOSIS — M7989 Other specified soft tissue disorders: Secondary | ICD-10-CM

## 2011-12-26 DIAGNOSIS — I824Y9 Acute embolism and thrombosis of unspecified deep veins of unspecified proximal lower extremity: Secondary | ICD-10-CM

## 2011-12-26 DIAGNOSIS — I10 Essential (primary) hypertension: Secondary | ICD-10-CM | POA: Insufficient documentation

## 2011-12-26 DIAGNOSIS — I82409 Acute embolism and thrombosis of unspecified deep veins of unspecified lower extremity: Secondary | ICD-10-CM

## 2011-12-26 LAB — APTT: aPTT: 32 s (ref 24–37)

## 2011-12-26 LAB — BASIC METABOLIC PANEL
Chloride: 101 mEq/L (ref 96–112)
GFR calc Af Amer: 82 mL/min — ABNORMAL LOW (ref >90)
GFR calc non Af Amer: 71 mL/min — ABNORMAL LOW (ref >90)
Glucose, Bld: 110 mg/dL — ABNORMAL HIGH (ref 70–99)
Potassium: 3.5 mEq/L (ref 3.5–5.1)
Sodium: 138 mEq/L (ref 135–145)

## 2011-12-26 LAB — CBC
HCT: 38.1 % (ref 36.0–46.0)
Hemoglobin: 12.9 g/dL (ref 12.0–15.0)
MCH: 28.9 pg (ref 26.0–34.0)
MCHC: 33.9 g/dL (ref 30.0–36.0)
MCV: 85.2 fL (ref 78.0–100.0)
Platelets: 263 K/uL (ref 150–400)
RBC: 4.47 MIL/uL (ref 3.87–5.11)
RDW: 14.8 % (ref 11.5–15.5)
WBC: 9.4 10*3/uL (ref 4.0–10.5)

## 2011-12-26 LAB — BASIC METABOLIC PANEL WITH GFR
BUN: 23 mg/dL (ref 6–23)
CO2: 27 meq/L (ref 19–32)
Calcium: 9.5 mg/dL (ref 8.4–10.5)
Creatinine, Ser: 0.8 mg/dL (ref 0.50–1.10)

## 2011-12-26 LAB — PROTIME-INR
INR: 1 (ref 0.00–1.49)
Prothrombin Time: 13.4 seconds (ref 11.6–15.2)

## 2011-12-26 MED ORDER — RIVAROXABAN 15 MG PO TABS: 15.0000 mg | ORAL_TABLET | Freq: Two times a day (BID) | ORAL | Status: DC

## 2011-12-26 MED ORDER — ENOXAPARIN SODIUM 100 MG/ML ~~LOC~~ SOLN
1.0000 mg/kg | Freq: Once | SUBCUTANEOUS | Status: AC
  Administered 2011-12-26: 85 mg via SUBCUTANEOUS
  Filled 2011-12-26: qty 1

## 2011-12-26 NOTE — ED Provider Notes (Signed)
This chart was scribed for Savannah Hinton. Savannah Lamas, MD by Savannah Hinton. The patient was seen in room MH07/MH07 at 6:07 PM.  CSN: [id number]  Arrival date & time 12/26/11  1745   First MD Initiated Contact with Patient 12/26/11 1806      Chief Complaint  Patient presents with  . DVT    (Consider location/radiation/quality/duration/timing/severity/associated sxs/prior treatment) Patient is a 77 y.o. female presenting with leg pain. The history is provided by the patient and the spouse.  Leg Pain  The incident occurred more than 2 days ago. The incident occurred at home. There was no injury mechanism. The pain is present in the right ankle and right knee. The pain is moderate. The pain has been constant since onset. Pertinent negatives include no loss of motion and no loss of sensation. She reports no foreign bodies present. The symptoms are aggravated by nothing. She has tried acetaminophen for the symptoms. The treatment provided no relief.   Savannah Hinton is a 77 y.o. female who presents to the Emergency Department complaining of acute onset moderate DVT. Pt had swelling, soreness, and pain in right leg that started 5 days ago. No recent travel. Pt denies any chest pain or dyspnea. Had pneumonia 1st of March now resolved. Ultrasound of leg done today at Dr. Sharlett Iles office and was sent here for DVT.   Past Medical History  Diagnosis Date  . Hypertension   . Allergy   . Hyperlipidemia   . Glaucoma     Past Surgical History  Procedure Date  . Retinal detachment surgery   . Appendectomy 1974  . Cholecystectomy 2012    Family History  Problem Relation Age of Onset  . Cancer Sister     breast    History  Substance Use Topics  . Smoking status: Never Smoker   . Smokeless tobacco: Never Used  . Alcohol Use: No    OB History    Grav Para Term Preterm Abortions TAB SAB Ect Mult Living                  Review of Systems  Constitutional: Negative for fever and activity  change.  Respiratory: Negative for chest tightness and shortness of breath.   Cardiovascular: Positive for leg swelling. Negative for chest pain.  All other systems reviewed and are negative.    Allergies  Codeine and Penicillins  Home Medications   Current Outpatient Rx  Name Route Sig Dispense Refill  . ASPIRIN 81 MG PO TABS Oral Take 81 mg by mouth daily.      . IBUPROFEN 200 MG PO TABS Oral Take 400 mg by mouth every 6 (six) hours as needed. Patient used this medication for her leg pain.    Marland Kitchen LISINOPRIL-HYDROCHLOROTHIAZIDE 20-12.5 MG PO TABS Oral Take 1 tablet by mouth daily.      Marland Kitchen ROSUVASTATIN CALCIUM 10 MG PO TABS Oral Take 10 mg by mouth daily.      Marland Kitchen TIMOLOL HEMIHYDRATE 0.5 % OP SOLN  1 drop 2 (two) times daily.      . TROLAMINE SALICYLATE 10 % EX CREA Topical Apply 1 application topically as needed. Patient used this medication for her leg pain as well.    Marland Kitchen VITAMIN E 400 UNITS PO CAPS Oral Take 400 Units by mouth daily.      Marland Kitchen CALCIUM CARBONATE ANTACID 500 MG PO CHEW Oral Chew 1 tablet by mouth daily.      . DESLORATADINE 5 MG PO TABS Oral  Take 5 mg by mouth daily.      Marland Kitchen HYDROCODONE-ACETAMINOPHEN 5-500 MG PO TABS      . CENTRUM PO CHEW Oral Chew 1 tablet by mouth daily.      Marland Kitchen RIVAROXABAN 15 MG PO TABS Oral Take 1 tablet (15 mg total) by mouth 2 (two) times daily with a meal. 42 tablet 0  . TIMOLOL MALEATE 0.5 % OP SOLN      . VITAMIN D (ERGOCALCIFEROL) 50000 UNITS PO CAPS Oral Take 50,000 Units by mouth.        BP 158/80  Pulse 101  Temp(Src) 98.9 F (37.2 C) (Oral)  Resp 20  Ht 5\' 4"  (1.626 m)  Wt 192 lb (87.091 kg)  BMI 32.96 kg/m2  SpO2 99%  Physical Exam  Nursing note and vitals reviewed. Constitutional: She is oriented to person, place, and time. She appears well-developed and well-nourished.  HENT:  Head: Normocephalic and atraumatic.  Eyes: EOM are normal. Pupils are equal, round, and reactive to light.  Neck: Normal range of motion. Neck supple.    Cardiovascular: Normal rate.   Pulmonary/Chest: Effort normal. No respiratory distress.  Musculoskeletal: Normal range of motion. She exhibits edema. She exhibits no tenderness.       Erythematous lower right leg 2+edema of right ankle and foot 2+edema of right lower leg and knee  Neurological: She is alert and oriented to person, place, and time.  Skin: Skin is warm and dry.  Psychiatric: She has a normal mood and affect. Her behavior is normal. Judgment and thought content normal.    ED Course  Procedures (including critical care time) DIAGNOSTIC STUDIES: Oxygen Saturation is 99% on room air, normal by my interpretation.    COORDINATION OF CARE:  Medications  ibuprofen (ADVIL,MOTRIN) 200 MG tablet (not administered)  trolamine salicylate (ASPERCREME) 10 % cream (not administered)  enoxaparin (LOVENOX) injection 85 mg (not administered)  Rivaroxaban (XARELTO) 15 MG TABS tablet (not administered)      Labs Reviewed  BASIC METABOLIC PANEL - Abnormal; Notable for the following:    Glucose, Bld 110 (*)    GFR calc non Af Amer 71 (*)    GFR calc Af Amer 82 (*)    All other components within normal limits  CBC  APTT  PROTIME-INR   US Venous Img Lower Unilateral Right  12/26/2011  **ADDENDUM** CREATED: 12/26/2011 17:26:05  The above report is incorrect.  See the correct report below.  *RADIOLOGY REPORT*  Clinical Data: Right leg pain and swelling  RIGHT LOWER EXTREMITY VENOUS DUPLEX ULTRASOUND  Technique:  Gray-scale sonography with graded compression, as well as color Doppler and duplex ultrasound, were performed to evaluate the deep venous system of the lower extremity from the level of the common femoral vein through the popliteal and proximal calf veins. Spectral Doppler was utilized to evaluate flow at rest and with distal augmentation maneuvers.  Comparison:  None.  Findings: There is thrombus in the femoral vein compatible with DVT which appears acute.  There is  diminished  compressibility in the common femoral vein.  There is lack of compressibility and augmentation in the profunda and superficial femoral vein and popliteal vein.  Greater saphenous vein in the mid thigh also is noncompressible and may contain superficial thrombosis.  IMPRESSION: Deep venous thrombosis in the right femoral venous system.  There also is superficial venous thrombosis.  **END ADDENDUM** SIGNED BY: Dineen Kid. Chestine Spore, M.D.    12/26/2011  *RADIOLOGY REPORT*  Clinical Data: Right leg pain  and swelling  RIGHT LOWER EXTREMITY VENOUS DUPLEX ULTRASOUND  Technique:  Gray-scale sonography with graded compression, as well as color Doppler and duplex ultrasound were performed to evaluate the deep venous system of the lower extremity from the level of the common femoral vein through the popliteal and proximal calf veins. Spectral Doppler was utilized to evaluate flow at rest and with distal augmentation maneuvers.  Comparison:  None.  Findings:  Normal compressibility of the common femoral, superficial femoral, and popliteal veins is demonstrated, as well as the visualized proximal calf veins.  No filling defects to suggest DVT on grayscale or color Doppler imaging.  Doppler waveforms show normal direction of venous flow, normal respiratory phasicity and response to augmentation.  IMPRESSION: No evidence of lower extremity deep vein thrombosis. Original Report Authenticated By: Camelia Phenes, M.D.    1. DVT (deep venous thrombosis)       MDM  I personally performed the services described in this documentation, which was scribed in my presence. The recorded information has been reviewed and considered.   Pt with no CP, pleusriy, SOB.  RA sat is 98%.  Pt reports able to walk, tolerable pain with just ibuprofen.  I recommended taking tylenol instead.  Offered codeine or hydrocodone but pt doesn't think she needs that strong of medication.  Will f/u with Dr. Foy Guadalajara tomorrow.  I discussed pt with Dr. Foy Guadalajara as  well.         Savannah Hinton. Savannah Lamas, MD 12/26/11 1932

## 2011-12-26 NOTE — Discharge Instructions (Signed)
Deep Vein Thrombosis A deep vein thrombosis (DVT) is a blood clot (thrombus) that develops in a deep vein. A DVT is a clot in the deep, larger veins of the leg, arm, or pelvis. These are more dangerous than clots that might form in veins on the surface of the body. Deep vein thrombosis can lead to complications if the clot breaks off and travels in the bloodstream to the lungs. CAUSES Blood clots form in a vein for different reasons. Usually several things cause blood clots. They include:  The flow of blood slows down.   The inside of the vein is damaged in some way.   The person has a condition that makes blood clot more easily. These conditions may include:   Older age (especially over 75 years old).   Having a history of blood clots.   Having major or lengthy surgery. Hip surgery is particularly high-risk.   Breaking a hip or leg.   Sitting or lying still for a long time.   Cancer or cancer treatment.   Having a long, thin tube (catheter) placed inside a vein during a medical procedure.   Being overweight (obese).   Pregnancy and childbirth.   Medicines with estrogen.   Smoking.   Other circulation or heart problems.  SYMPTOMS When a clot forms, it can either partially or totally block the blood flow in that vein. Symptoms of a DVT can include:  Swelling of the leg or arm, especially if one side is much worse.   Warmth and redness of the leg or arm, especially if one side is much worse.   Pain in an arm or leg. If the clot is in the leg, symptoms may be more noticeable or worse when standing or walking.  If the blood clot travels to the lung, it may cause:  Shortness of breath.   Chest pain. The pain may be worsened by deep breaths.   Coughing up thick mucus (phlegm), possibly flecked with blood.  Anyone with these symptoms should get emergency medical treatment right away. Call your local emergency services (911 in [place] if you have these symptoms. DIAGNOSIS If  a DVT is suspected, your caregiver will take a full medical history. He or she will also perform a physical exam. Tests that also may be required include:  Studies of the clotting properties of the blood.   An ultrasound scan.   X-rays to show the flow of blood when special dye is injected into the veins (venography).   Studies of your lungs if you have any chest symptoms.  PREVENTION  Exercise the legs regularly. Take a brisk 30 minute walk every day.   Maintain a weight that is appropriate for your height.   Avoid sitting or lying in bed for long periods of time without moving your legs.   Women, particularly those over the age of 35, should consider the risks and benefits of taking estrogen medicines, including birth control pills.   Do not smoke, especially if you take estrogen medicines.   Long-distance travel can increase your risk. You should exercise your legs by walking or pumping the muscles every hour.   In hospital prevention:   Prevention may include medical and nonmedical measures.  TREATMENT  The most common treatment for DVT is blood thinning (anticoagulant) medicine, which reduces the blood's tendency to clot. Anticoagulants can stop new blood clots from forming and old ones from growing. They cannot dissolve existing clots. Your body does this by itself over time.   Anticoagulants can be given by mouth, by intravenous (IV) access, or by injection. Your caregiver will determine the best program for you.   Less commonly, clot-dissolving drugs (thrombolytics) are used to dissolve a DVT. They carry a high risk of bleeding, so they are used mainly in severe cases.   Very rarely, a blood clot in the leg needs to be removed surgically.   If you are unable to take anticoagulants, your caregiver may arrange for you to have a filter placed in a main vein in your belly (abdomen). This filter prevents clots from traveling to your lungs.  HOME CARE INSTRUCTIONS  Take all  medicines prescribed by your caregiver. Follow the directions carefully.   You will most likely continue taking anticoagulants after you leave the hospital. Your caregiver will advise you on the length of treatment (usually 3 to 6 months, sometimes for life).   Taking too much or too little of an anticoagulant is dangerous. While taking this type of medicine, you will need to have regular blood tests to be sure the dose is correct. The dose can change for many reasons. It is critically important that you take this medicine exactly as prescribed, and that you have blood tests exactly as directed.   Many foods can interfere with anticoagulants. These include foods high in vitamin K, such as spinach, kale, broccoli, cabbage, collard and turnip greens, Brussels sprouts, peas, cauliflower, seaweed, parsley, beef and pork liver, green tea, and soybean oil. Your caregiver should discuss limits on these foods with you or you should arrange a visit with a dietician to answer your questions.   Many medicines can interfere with anticoagulants. You must tell your caregiver about any and all medicines you take. This includes all vitamins and supplements. Be especially cautious with aspirin and anti-inflammatory medicines. Ask your caregiver before taking these.   Anticoagulants can have side effects, mostly excessive bruising or bleeding. You will need to hold pressure over cuts for longer than usual. Avoid alcoholic drinks or consume only very small amounts while taking this medicine.   If you are taking an anticoagulant:   Wear a medical alert bracelet.   Notify your dentist or other caregivers before procedures.   Avoid contact sports.   Ask your caregiver how soon you can go back to normal activities. Not being active can lead to new clots. Ask for a list of what you should and should not do.   Exercise your lower leg muscles. This is important while traveling.   You may need to wear compression  stockings. These are tight elastic stockings that apply pressure to the lower legs. This can help keep the blood in the legs from clotting.   If you are a smoker, you should quit.   Learn as much as you can about DVT.  SEEK MEDICAL CARE IF:  You have unusual bruising or any bleeding problems.   The swelling or pain in your affected arm or leg is not gradually improving.   You anticipate surgery or long-distance travel. You should get specific advice on DVT prevention.   You discover other family members with blood clots. This may require further testing for inherited diseases or conditions.  SEEK IMMEDIATE MEDICAL CARE IF:  You develop chest pain.   You develop severe shortness of breath.   You begin to cough up bloody mucus or phlegm (sputum).   You feel dizzy or faint.   You develop swelling or pain in the leg.   You have   breathing problems after traveling.  MAKE SURE YOU:  Understand these instructions.   Will watch your condition.   Will get help right away if you are not doing well or get worse.  Document Released: 09/12/2005 Document Revised: 09/01/2011 Document Reviewed: 11/04/2010 [employer] Patient Information 2012 [employer]. 

## 2011-12-26 NOTE — ED Notes (Signed)
Pt states that Dr Foy Guadalajara sent her for [place] of RLE for possible dvt, pt was told to come to ED after procedure.

## 2012-02-17 ENCOUNTER — Other Ambulatory Visit: Payer: Self-pay | Admitting: Gynecology

## 2012-02-17 DIAGNOSIS — R928 Other abnormal and inconclusive findings on diagnostic imaging of breast: Secondary | ICD-10-CM

## 2012-02-27 ENCOUNTER — Ambulatory Visit
Admission: RE | Admit: 2012-02-27 | Discharge: 2012-02-27 | Disposition: A | Discharge status: Home / Self Care | Payer: Medicare Other | Source: Ambulatory Visit | Attending: Gynecology | Admitting: Gynecology

## 2012-02-27 DIAGNOSIS — R928 Other abnormal and inconclusive findings on diagnostic imaging of breast: Secondary | ICD-10-CM

## 2012-07-26 ENCOUNTER — Other Ambulatory Visit: Payer: Self-pay | Admitting: Family Medicine

## 2012-07-26 DIAGNOSIS — N6009 Solitary cyst of unspecified breast: Secondary | ICD-10-CM

## 2012-09-03 ENCOUNTER — Ambulatory Visit
Admission: RE | Admit: 2012-09-03 | Discharge: 2012-09-03 | Disposition: A | Discharge status: Home / Self Care | Payer: Medicare Other | Source: Ambulatory Visit | Attending: Family Medicine | Admitting: Family Medicine

## 2012-09-03 DIAGNOSIS — N6009 Solitary cyst of unspecified breast: Secondary | ICD-10-CM

## 2013-01-08 ENCOUNTER — Other Ambulatory Visit: Payer: Self-pay

## 2013-01-08 DIAGNOSIS — Z1231 Encounter for screening mammogram for malignant neoplasm of breast: Secondary | ICD-10-CM

## 2013-02-25 ENCOUNTER — Ambulatory Visit
Admission: RE | Admit: 2013-02-25 | Discharge: 2013-02-25 | Disposition: A | Discharge status: Home / Self Care | Payer: Medicare Other | Source: Ambulatory Visit

## 2013-02-25 DIAGNOSIS — Z1231 Encounter for screening mammogram for malignant neoplasm of breast: Secondary | ICD-10-CM

## 2013-07-11 ENCOUNTER — Ambulatory Visit (INDEPENDENT_AMBULATORY_CARE_PROVIDER_SITE_OTHER): Payer: Medicare Other | Admitting: Neurology

## 2013-07-11 ENCOUNTER — Encounter: Payer: Self-pay | Admitting: Neurology

## 2013-07-11 VITALS — BP 165/92 | HR 97 | Ht 65.0 in | Wt 197.0 lb

## 2013-07-11 DIAGNOSIS — I10 Essential (primary) hypertension: Secondary | ICD-10-CM

## 2013-07-11 DIAGNOSIS — E785 Hyperlipidemia, unspecified: Secondary | ICD-10-CM

## 2013-07-11 DIAGNOSIS — F09 Unspecified mental disorder due to known physiological condition: Secondary | ICD-10-CM

## 2013-07-11 DIAGNOSIS — R4189 Other symptoms and signs involving cognitive functions and awareness: Secondary | ICD-10-CM | POA: Insufficient documentation

## 2013-07-11 NOTE — Progress Notes (Signed)
[facility]  PATIENT: Savannah Hinton DOB: [birth year]  HISTORICAL  Maygan is a 78 years old right-handed Caucasian female, referred by her primary care physician Dr. Marinda Elk, for evaluation of memory loss.  She is accompanied by her son, and her husband at today's clinical visit.  She had past medical history of hypertension, bilateral cataract surgery, right hand surgery, had 12 years of education, is a retired [employer] operational officer.  Since 2013, she was noticed to have mild word finding difficulties, memory trouble,  difficulty working with members, difficulty to balance her checkbook, she denied visual loss, she has good appetite, sleeping well, she has occasionally diarrhea with greasy food.  Her word finding difficulty has gotten worse over the past 3 months, herself does not agree that she has significant memory trouble  Both of her parents died of stroke at age 46s, her older sister died of Alzheimer's at age 34  REVIEW OF SYSTEMS: Full 14 system review of systems performed and notable only for diarrhea, memory loss, allergy  ALLERGIES: Allergies  Allergen Reactions  . Codeine     hallucinations  . Penicillins Other (See Comments)    Yeast infection     HOME MEDICATIONS: Outpatient Prescriptions Prior to Visit  Medication Sig Dispense Refill  . aspirin 81 MG tablet Take 81 mg by mouth 2 (two) times daily.       . calcium carbonate (TUMS - DOSED IN MG ELEMENTAL CALCIUM) 500 MG chewable tablet Chew 1 tablet by mouth daily.        Marland Kitchen HYDROcodone-acetaminophen (VICODIN) 5-500 MG per tablet       . lisinopril-hydrochlorothiazide (PRINZIDE,ZESTORETIC) 20-12.5 MG per tablet Take 1 tablet by mouth daily.        . multivitamin-iron-minerals-folic acid (CENTRUM) chewable tablet Chew 1 tablet by mouth daily.        . Rivaroxaban (XARELTO) 15 MG TABS tablet Take 1 tablet (15 mg total) by mouth 2 (two) times daily with a meal.  42 tablet  0  . timolol (BETIMOL) 0.5  % ophthalmic solution 1 drop 2 (two) times daily.        . timolol (TIMOPTIC) 0.5 % ophthalmic solution       . vitamin E 400 UNIT capsule Take 400 Units by mouth daily.        Marland Kitchen desloratadine (CLARINEX) 5 MG tablet Take 5 mg by mouth daily.        Marland Kitchen ibuprofen (ADVIL,MOTRIN) 200 MG tablet Take 400 mg by mouth every 6 (six) hours as needed. Patient used this medication for her leg pain.      . rosuvastatin (CRESTOR) 10 MG tablet Take 10 mg by mouth daily.        Marland Kitchen trolamine salicylate (ASPERCREME) 10 % cream Apply 1 application topically as needed. Patient used this medication for her leg pain as well.      . Vitamin D, Ergocalciferol, (DRISDOL) 50000 UNITS CAPS Take 50,000 Units by mouth.         No facility-administered medications prior to visit.    PAST MEDICAL HISTORY: Past Medical History  Diagnosis Date  . Hypertension   . Allergy   . Hyperlipidemia   . Glaucoma     PAST SURGICAL HISTORY: Past Surgical History  Procedure Laterality Date  . Retinal detachment surgery    . Appendectomy  1974  . Cholecystectomy  2012    FAMILY HISTORY: Family History  Problem Relation Age of Onset  . Cancer Sister  breast    SOCIAL HISTORY:  History   Social History  . Marital Status: Married    Spouse Name: N/A    Number of Children: N/A  . Years of Education: N/A   Occupational History  . Not on file.   Social History Main Topics  . Smoking status: Never Smoker   . Smokeless tobacco: Never Used  . Alcohol Use: No  . Drug Use: No  . Sexual Activity:    Other Topics Concern  . Not on file   Social History Narrative  . No narrative on file     PHYSICAL EXAM   Filed Vitals:   07/11/13 1027  BP: 165/92  Pulse: 97  Height: 5\' 5"  (1.651 m)  Weight: 197 lb (89.359 kg)   Body mass index is 32.78 kg/(m^2).   Generalized: In no acute distress  Neck: Supple, no carotid bruits   Cardiac: Regular rate rhythm  Pulmonary: Clear to auscultation  bilaterally  Musculoskeletal: No deformity  Neurological examination  Mentation: she has word finding, name difficulties, MMSE 25/30, she missed 2/3 recalls, could not copy figure, could not spell WORLD backwards  Cranial nerve II-XII: Pupils were equal round reactive to light extraocular movements were full, visual field were full on confrontational test. facial sensation and strength were normal. hearing was intact to finger rubbing bilaterally. Uvula tongue midline.  head turning and shoulder shrug and were normal and symmetric.Tongue protrusion into cheek strength was normal.  Motor: normal tone, bulk and strength.  Sensory: Intact to fine touch, pinprick, preserved vibratory sensation, and proprioception at toes.  Coordination: Normal finger to nose, heel-to-shin bilaterally there was no truncal ataxia  Gait: Rising up from seated position without assistance, normal stance, without trunk ataxia, moderate stride, good arm swing, smooth turning, able to perform tiptoe, and heel walking without difficulty.   Romberg signs: Negative  Deep tendon reflexes: Brachioradialis 2/2, biceps 2/2, triceps 2/2, patellar 2/2, Achilles 2/2, plantar responses were flexor bilaterally.   DIAGNOSTIC DATA (LABS, IMAGING, TESTING) - I reviewed patient records, labs, notes, testing and imaging myself where available.  Lab Results  Component Value Date   WBC 9.4 12/26/2011   HGB 12.9 12/26/2011   HCT 38.1 12/26/2011   MCV 85.2 12/26/2011   PLT 263 12/26/2011      Component Value Date/Time   NA 138 12/26/2011 1854   K 3.5 12/26/2011 1854   CL 101 12/26/2011 1854   CO2 27 12/26/2011 1854   GLUCOSE 110* 12/26/2011 1854   BUN 23 12/26/2011 1854   CREATININE 0.80 12/26/2011 1854   CALCIUM 9.5 12/26/2011 1854   PROT 7.5 10/03/2009 0904   ALBUMIN 4.2 10/03/2009 0904   AST 35 10/03/2009 0904   ALT 17 10/03/2009 0904   ALKPHOS 65 10/03/2009 0904   BILITOT 1.1 10/03/2009 0904   GFRNONAA 71* 12/26/2011 1854   GFRAA 82* 12/26/2011 1854      ASSESSMENT AND PLAN 78 years old Caucasian female, with gradual onset memory trouble, word finding difficulties, today's Mini-Mental Status Examination is 25 out of 65, her older sister died of Alzheimer's disease,  1. most likely early onset dementia, Alzheimer's disease, 2.  complete evaluation with MRI of the brain, 3 laboratory evaluation, 4 information about research trial LZAX was also provided.        Levert Feinstein, M.D. Ph.D.  [facility] [address] [place], [place] [address] [phone]

## 2013-07-13 LAB — COMPREHENSIVE METABOLIC PANEL
Albumin: 4.5 g/dL (ref 3.5–4.8)
BUN: 20 mg/dL (ref 8–27)
CO2: 28 mmol/L (ref 18–29)
Calcium: 9.6 mg/dL (ref 8.6–10.2)
Creatinine, Ser: 0.91 mg/dL (ref 0.57–1.00)
Globulin, Total: 2.5 g/dL (ref 1.5–4.5)
Total Protein: 7 g/dL (ref 6.0–8.5)

## 2013-07-13 LAB — CBC
Platelets: 185 10*3/uL (ref 150–379)
RBC: 4.85 x10E6/uL (ref 3.77–5.28)
RDW: 15.1 % (ref 12.3–15.4)
WBC: 7.7 10*3/uL (ref 3.4–10.8)

## 2013-07-13 LAB — FOLATE: Folate: >19.9 ng/mL (ref >3.0)

## 2013-07-13 LAB — SEDIMENTATION RATE: Sed Rate: 11 mm/hr (ref 0–40)

## 2013-07-13 LAB — THYROID PANEL WITH TSH
Free Thyroxine Index: 2.1 (ref 1.2–4.9)
T3 Uptake Ratio: 31 % (ref 24–39)

## 2013-07-13 LAB — VITAMIN B12: Vitamin B-12: 232 pg/mL (ref 211–946)

## 2013-07-15 NOTE — Progress Notes (Signed)
Quick Note:  Please call patient, mildly low Vitamin B12, she needs overcounter B12 supplement. ______

## 2013-07-16 ENCOUNTER — Encounter: Payer: Self-pay | Admitting: *Deleted

## 2013-07-16 NOTE — Progress Notes (Signed)
Quick Note:  I called and gave the results of lab tests below. Recommendations for otc B12 supplement. Letter with results mailed. Husband verbalized understanding. Asked about mychart. He will look into setting up with help from son. ______

## 2013-07-26 ENCOUNTER — Ambulatory Visit
Admission: RE | Admit: 2013-07-26 | Discharge: 2013-07-26 | Disposition: A | Discharge status: Home / Self Care | Payer: Medicare Other | Source: Ambulatory Visit | Attending: Neurology | Admitting: Neurology

## 2013-07-26 DIAGNOSIS — I10 Essential (primary) hypertension: Secondary | ICD-10-CM

## 2013-07-26 DIAGNOSIS — R4189 Other symptoms and signs involving cognitive functions and awareness: Secondary | ICD-10-CM

## 2013-07-26 DIAGNOSIS — R413 Other amnesia: Secondary | ICD-10-CM

## 2013-07-26 DIAGNOSIS — E785 Hyperlipidemia, unspecified: Secondary | ICD-10-CM

## 2013-07-29 NOTE — Progress Notes (Signed)
Quick Note:  Please call patient, MRI brain showed age related changes, no acute lesions. ______ 

## 2013-08-01 NOTE — Progress Notes (Signed)
Quick Note:  Left message that MRI brain shows age related changes, per Dr. Terrace Arabia. Told to call with any questions. ______

## 2013-10-14 ENCOUNTER — Encounter: Payer: Self-pay | Admitting: Neurology

## 2013-10-14 ENCOUNTER — Ambulatory Visit (INDEPENDENT_AMBULATORY_CARE_PROVIDER_SITE_OTHER): Payer: Medicare Other | Admitting: Neurology

## 2013-10-14 VITALS — BP 194/97 | HR 98 | Ht 65.0 in | Wt 201.0 lb

## 2013-10-14 DIAGNOSIS — R413 Other amnesia: Secondary | ICD-10-CM

## 2013-10-14 MED ORDER — DONEPEZIL HCL 10 MG PO TABS: ORAL_TABLET | ORAL | Status: DC

## 2013-10-14 NOTE — Progress Notes (Signed)
[facility]  PATIENT: Savannah Hinton DOB: [birth year]  HISTORICAL  Lanika is a 79 years old right-handed Caucasian female, referred by her primary care physician Dr. Marinda Elk, for evaluation of memory loss.  She is accompanied by her son, and her husband at today's clinical visit.  She had past medical history of hypertension, bilateral cataract surgery, right hand surgery, had 12 years of education, is a retired [employer] operational officer.  Since 2013, she was noticed to have mild word finding difficulties, memory trouble,  difficulty working with members, difficulty to balance her checkbook, she denied visual loss, she has good appetite, sleeping well, she has occasionally diarrhea with greasy food.  Her word finding difficulty has gotten worse over the past 3 months, herself does not agree that she has significant memory trouble  Both of her parents died of stroke at age 46s, her older sister died of Alzheimer's at age 68  UPDATE Jan 19th 2015; She is again accompanied by husband, and her son at today's clinical visit, she continued to have mild memory trouble, have reviewed MRI of the brain together, which has demonstrated moderate atrophy, mainly involving bilateral frontal, parietal, temporal region, mild small vessel disease, laboratory showed low normal B12, 220, rest of the laboratory was normal including RPR, TSH, ANA, CMP with exception of mild elevated glucose 130, , CBC,  She is taking B12 supplement by mouth, but family noticed mild improvement,,       REVIEW OF SYSTEMS: Full 14 system review of systems performed and notable only for diarrhea, memory loss,  ALLERGIES: Allergies  Allergen Reactions  . Codeine     hallucinations  . Penicillins Other (See Comments)    Yeast infection     HOME MEDICATIONS: Outpatient Prescriptions Prior to Visit  Medication Sig Dispense Refill  . aspirin 81 MG tablet Take 81 mg by mouth 2 (two) times daily.       . calcium  carbonate (TUMS - DOSED IN MG ELEMENTAL CALCIUM) 500 MG chewable tablet Chew 1 tablet by mouth as needed.       Marland Kitchen lisinopril-hydrochlorothiazide (PRINZIDE,ZESTORETIC) 20-12.5 MG per tablet Take 1 tablet by mouth daily.        . multivitamin-iron-minerals-folic acid (CENTRUM) chewable tablet Chew 1 tablet by mouth daily.        . timolol (TIMOPTIC) 0.5 % ophthalmic solution       . azithromycin (ZITHROMAX) 250 MG tablet Take 250 mg by mouth daily.      Marland Kitchen HYDROcodone-acetaminophen (VICODIN) 5-500 MG per tablet       . Rivaroxaban (XARELTO) 15 MG TABS tablet Take 1 tablet (15 mg total) by mouth 2 (two) times daily with a meal.  42 tablet  0  . silver sulfADIAZINE (SILVADENE) 1 % cream       . timolol (BETIMOL) 0.5 % ophthalmic solution Place 1 drop into both eyes 2 (two) times daily.       . vitamin E 400 UNIT capsule Take 400 Units by mouth daily.         No facility-administered medications prior to visit.    PAST MEDICAL HISTORY: Past Medical History  Diagnosis Date  . Hypertension   . Allergy   . Hyperlipidemia   . Glaucoma   . Memory loss     PAST SURGICAL HISTORY: Past Surgical History  Procedure Laterality Date  . Retinal detachment surgery    . Appendectomy  1974  . Cholecystectomy  2012    FAMILY HISTORY: Family History  Problem Relation Age of Onset  . Cancer Sister     breast    SOCIAL HISTORY:  History   Social History  . Marital Status: Married    Spouse Name: N/A    Number of Children: N/A  . Years of Education: N/A   Occupational History  . Not on file.   Social History Main Topics  . Smoking status: Never Smoker   . Smokeless tobacco: Never Used  . Alcohol Use: No  . Drug Use: No  . Sexual Activity: Not on file   Other Topics Concern  . Not on file   Social History Narrative  . No narrative on file     PHYSICAL EXAM   Filed Vitals:   10/14/13 1533  BP: 194/97  Pulse: 98  Height: 5\' 5"  (1.651 m)  Weight: 201 lb (91.173 kg)   Body  mass index is 33.45 kg/(m^2).   Generalized: In no acute distress  Neck: Supple, no carotid bruits   Cardiac: Regular rate rhythm  Pulmonary: Clear to auscultation bilaterally  Musculoskeletal: No deformity  Neurological examination  Mentation: she has word finding, name difficulties, MMSE 27/30, she missed 2/3 recalls,   could not spell WORLD backwards  Cranial nerve II-XII: Pupils were equal round reactive to light extraocular movements were full, visual field were full on confrontational test. facial sensation and strength were normal. hearing was intact to finger rubbing bilaterally. Uvula tongue midline.  head turning and shoulder shrug and were normal and symmetric.Tongue protrusion into cheek strength was normal.  Motor: normal tone, bulk and strength.  Sensory: Intact to fine touch, pinprick, preserved vibratory sensation, and proprioception at toes.  Coordination: Normal finger to nose, heel-to-shin bilaterally there was no truncal ataxia  Gait: Rising up from seated position without assistance, normal stance, without trunk ataxia, moderate stride, good arm swing, smooth turning, able to perform tiptoe, and heel walking without difficulty.   Romberg signs: Negative  Deep tendon reflexes: Brachioradialis 2/2, biceps 2/2, triceps 2/2, patellar 2/2, Achilles 2/2, plantar responses were flexor bilaterally.   DIAGNOSTIC DATA (LABS, IMAGING, TESTING) - I reviewed patient records, labs, notes, testing and imaging myself where available.  Lab Results  Component Value Date   WBC 7.7 07/11/2013   HGB 14.5 07/11/2013   HCT 41.5 07/11/2013   MCV 86 07/11/2013   PLT 185 07/11/2013      Component Value Date/Time   NA 142 07/11/2013 1316   NA 138 12/26/2011 1854   K 3.5 07/11/2013 1316   CL 98 07/11/2013 1316   CO2 28 07/11/2013 1316   GLUCOSE 131* 07/11/2013 1316   GLUCOSE 110* 12/26/2011 1854   BUN 20 07/11/2013 1316   BUN 23 12/26/2011 1854   CREATININE 0.91 07/11/2013  1316   CALCIUM 9.6 07/11/2013 1316   PROT 7.0 07/11/2013 1316   PROT 7.5 10/03/2009 0904   ALBUMIN 4.2 10/03/2009 0904   AST 13 07/11/2013 1316   ALT 13 07/11/2013 1316   ALKPHOS 66 07/11/2013 1316   BILITOT 0.4 07/11/2013 1316   GFRNONAA 62 07/11/2013 1316   GFRAA 71 07/11/2013 1316     ASSESSMENT AND PLAN:  79 years old Caucasian female, with gradual onset memory trouble, word finding difficulties, today's Mini-Mental Status Examination is 64 out of 30, her older sister died of Alzheimer's disease, MRI of the brain has demonstrated moderate atrophy mainly involving bilateral frontal, parietal, and temporal region   1. most likely early onset dementia, Alzheimer's disease, 2.   we  will start Aricept 10 mg every day,  3.I also encouraged her to have moderate exercise  4 return to clinic with Eber Jonesarolyn in 6 months .         Levert FeinsteinYijun Logen Fowle, M.D. Ph.D.  [facility] [address], [address] [place], [place] [address] [phone]

## 2013-12-11 ENCOUNTER — Other Ambulatory Visit: Payer: Self-pay

## 2013-12-11 DIAGNOSIS — Z1231 Encounter for screening mammogram for malignant neoplasm of breast: Secondary | ICD-10-CM

## 2014-01-30 ENCOUNTER — Other Ambulatory Visit (HOSPITAL_BASED_OUTPATIENT_CLINIC_OR_DEPARTMENT_OTHER): Payer: Self-pay | Admitting: Physician Assistant

## 2014-01-30 ENCOUNTER — Ambulatory Visit (HOSPITAL_BASED_OUTPATIENT_CLINIC_OR_DEPARTMENT_OTHER)
Admission: RE | Admit: 2014-01-30 | Discharge: 2014-01-30 | Disposition: A | Discharge status: Home / Self Care | Payer: Medicare Other | Source: Ambulatory Visit | Attending: Physician Assistant | Admitting: Physician Assistant

## 2014-01-30 DIAGNOSIS — M7989 Other specified soft tissue disorders: Secondary | ICD-10-CM

## 2014-01-30 DIAGNOSIS — Z87828 Personal history of other (healed) physical injury and trauma: Secondary | ICD-10-CM | POA: Insufficient documentation

## 2014-02-21 ENCOUNTER — Telehealth: Payer: Self-pay

## 2014-02-21 NOTE — Telephone Encounter (Signed)
Dr.Skains received call from Dr.Fried.Appointment scheduled with Dr.Skains Monday 02/24/14 at 2:45 pm for new onset atrial fib.Patient's husband called and was told of appointment.

## 2014-02-24 ENCOUNTER — Encounter: Payer: Self-pay | Admitting: Cardiology

## 2014-02-24 ENCOUNTER — Ambulatory Visit (INDEPENDENT_AMBULATORY_CARE_PROVIDER_SITE_OTHER): Payer: Medicare Other | Admitting: Cardiology

## 2014-02-24 VITALS — BP 172/90 | HR 120 | Ht 64.0 in | Wt 205.0 lb

## 2014-02-24 DIAGNOSIS — I872 Venous insufficiency (chronic) (peripheral): Secondary | ICD-10-CM

## 2014-02-24 DIAGNOSIS — G300 Alzheimer's disease with early onset: Secondary | ICD-10-CM

## 2014-02-24 DIAGNOSIS — Z7901 Long term (current) use of anticoagulants: Secondary | ICD-10-CM

## 2014-02-24 DIAGNOSIS — F0280 Dementia in other diseases classified elsewhere without behavioral disturbance: Secondary | ICD-10-CM

## 2014-02-24 DIAGNOSIS — I4891 Unspecified atrial fibrillation: Secondary | ICD-10-CM

## 2014-02-24 DIAGNOSIS — G309 Alzheimer's disease, unspecified: Secondary | ICD-10-CM

## 2014-02-24 DIAGNOSIS — I10 Essential (primary) hypertension: Secondary | ICD-10-CM | POA: Insufficient documentation

## 2014-02-24 MED ORDER — DILTIAZEM HCL ER COATED BEADS 240 MG PO CP24: 240.0000 mg | ORAL_CAPSULE | Freq: Every day | ORAL | Status: DC

## 2014-02-24 NOTE — Patient Instructions (Addendum)
Your physician has requested that you have an echocardiogram. Echocardiography is a painless test that uses sound waves to create images of your heart. It provides your doctor with information about the size and shape of your heart and how well your heart's chambers and valves are working. This procedure takes approximately one hour. There are no restrictions for this procedure.  Your physician has recommended you make the following change in your medication:  1. Start Diltiazem CD 240 mg 1 tab daily  Your physician recommends that you schedule a follow-up appointment in: 1 week with a PA

## 2014-02-24 NOTE — Progress Notes (Signed)
[address] [place], [place]  [address] Phone: [phone]) [phone] Fax:  [phone]  Date:  02/24/2014   ID:  Savannah Hinton Westley, DOB 1934/11/28, MRN [id number]  PCP:  Lenora BoysFRIED, ROBERT L, MD   History of Present Illness: Savannah Hinton Nikolov is a 79 y.o. female here for the evaluation of newly discovered atrial fibrillation with rapid ventricular response. I spoke with Dr. Foy GuadalajaraFried and 02/20/14. Unfortunately, she has been diagnosed with early onset dementia. He discovered atrial fibrillation and placed her on Eliquis 5 mg twice a day. She is resistant statins in the past because of myalgias. She has a history of DVT in the past. She was hemodynamically stable with rate of 110. Feels well. TSH 2.09, LDL 119, hemoglobin 13.3, platelets 233, creatinine 1.1, potassium 4.4.  She had been on Xarelto the past for DVT. She does have some chronic lower extremity edema, right greater than left. She did undergo a lower extremity ultrasound which was negative for DVT.   Wt Readings from Last 3 Encounters:  02/24/14 205 lb (92.987 kg)  10/14/13 201 lb (91.173 kg)  07/11/13 197 lb (89.359 kg)     Past Medical History  Diagnosis Date  . Hypertension   . Allergy   . Hyperlipidemia   . Glaucoma   . Memory loss     Past Surgical History  Procedure Laterality Date  . Retinal detachment surgery    . Appendectomy  1974  . Cholecystectomy  2012    Current Outpatient Prescriptions  Medication Sig Dispense Refill  . Acetaminophen (TYLENOL PO) Take by mouth. Prn      . apixaban (ELIQUIS) 5 MG TABS tablet Take 5 mg by mouth 2 (two) times daily.      . calcium carbonate (TUMS - DOSED IN MG ELEMENTAL CALCIUM) 500 MG chewable tablet Chew 1 tablet by mouth as needed.       . Cholecalciferol (VITAMIN D3) 3000 UNITS TABS Take by mouth daily.      . Cyanocobalamin (VITAMIN B 12 PO) Take by mouth daily.      Marland Kitchen. donepezil (ARICEPT) 10 MG tablet 1/2 tab po qday xone week, then one tab po qday.  30  tablet  12  . lisinopril-hydrochlorothiazide (PRINZIDE,ZESTORETIC) 20-12.5 MG per tablet Take 1 tablet by mouth 2 (two) times daily.       . NON FORMULARY allergy shots      . NON FORMULARY Stool softner      . timolol (TIMOPTIC) 0.5 % ophthalmic solution as needed.        No current facility-administered medications for this visit.    Allergies:    Allergies  Allergen Reactions  . Codeine     hallucinations  . Penicillins Other (See Comments)    Yeast infection     Social History:  The patient  reports that she has never smoked. She has never used smokeless tobacco. She reports that she does not drink alcohol or use illicit drugs.   Family History  Problem Relation Age of Onset  . Cancer Sister     breast    ROS:  Please see the history of present illness.   Positive for early onset dementia. No chest pain, no shortness of breath, no fevers, no rashes, no orthopnea, no PND. No recent surgeries, no recent infections.   All other systems reviewed and negative.   PHYSICAL EXAM: VS:  BP 172/90  Pulse 120  Ht 5\' 4"  (1.626 Hinton)  Wt 205 lb (92.987 kg)  BMI 35.17 kg/m2 Well nourished, well developed, in no acute distress HEENT: normal, [place]/AT, EOMI Neck: no JVD, normal carotid upstroke, no bruit Cardiac:  normal S1, S2; irreg irreg tachy; no murmur Lungs:  clear to auscultation bilaterally, no wheezing, rhonchi or rales Abd: soft, nontender, no hepatomegaly, no bruits Ext: 2+ R>L edema, compression hose, 2+ distal pulses Skin: warm and dry GU: deferred Neuro: no focal abnormalities noted, Alert in NAD. Husband present.   EKG:  02/24/14-atrial fibrillation heart rate 120 with nonspecific ST-T wave changes.  Lab work, prior office visits with Dr. Foy Guadalajara reviewed as above.    ASSESSMENT AND PLAN:  1. Newly diagnosed atrial fibrillation-we will improve rate control with initiation of diltiazem 240 mg CD. This may need to be up titrated and we will have her come back to clinic for  further evaluation of heart rate. Side effects of medications discussed. Check ECHO. 2. Chronic anticoagulation-agree with Eliquis. Creatinine, hemoglobin normal.CHADSVASc - at least 4 (Female, age 30, HTN).  3. Chronic venous insufficiency/lower extremity edema-monitor especially with new onset diltiazem. 4. Early onset dementia-Dr. Terrace Arabia 5. One week followup. Trying to maintain rate control. Titrate medications. Of course, if cardiomyopathy is present, likely transition over to metoprolol.  Signed, Donato Schultz, MD [facility]  02/24/2014 2:48 PM

## 2014-02-26 ENCOUNTER — Ambulatory Visit
Admission: RE | Admit: 2014-02-26 | Discharge: 2014-02-26 | Disposition: A | Discharge status: Home / Self Care | Payer: Medicare Other | Source: Ambulatory Visit

## 2014-02-26 DIAGNOSIS — Z1231 Encounter for screening mammogram for malignant neoplasm of breast: Secondary | ICD-10-CM

## 2014-02-27 ENCOUNTER — Telehealth: Payer: Self-pay | Admitting: Cardiology

## 2014-02-27 NOTE — Telephone Encounter (Signed)
New message     Diltiazem is too strong.  She feels like she is going to pass out-- latest bp was 108/75 pulse 71.  It has been lower.  Husband thinks the medication is too strong for her. Please advise

## 2014-02-27 NOTE — Telephone Encounter (Signed)
Pts Husband contacted office because he states his wife was seen on 02/24/14 by Dr Anne Fu and was started on Diltiazem 240mg  po daily due to new onset of afib.  Husband states that ever since pt has started taking this med, she has become very fatigued and BP & HR readings are low.  Current readings are: 02-27-14    at 11am  BP- 106/70  HR-58    11:35AM - 104/56 48 11:40AM- 109/57 48 11:46 AM- 117/68 46 12PM- 102/60 48 12:10PM - 108/78  71 Informed Husband that it is not necessary to take her BP every few minutes, especially in the same arm. Also advised to take pts BP and HR at the same time daily.  Informed pt that readings are stable.  Husband states wife denies any cp or sob. Wife is a little more fatigued though.  Husband states she's alert and responsive, and in no acute distress.  Husband fears that with her HR going down to the upper 40's, she will "die." Provided teaching and reassurance.  Informed Husband that Dr Anne Fu is currently out of the office today, but I will forward this message to him for further review and recommendation, and someone from the office will contact him back.  Advised Husband that if becomes acutely distressed, has increased HR, cp, and increased sob, or has aloc, then he should contact 911 and inform our office Husband verbalized understanding and agrees with this plan.

## 2014-02-28 MED ORDER — DILTIAZEM HCL ER COATED BEADS 120 MG PO CP24: 120.0000 mg | ORAL_CAPSULE | Freq: Every day | ORAL | Status: DC

## 2014-02-28 NOTE — Telephone Encounter (Signed)
Pt and Husband contacted about Dr Judd Gaudier giving the ok for pt to decrease diltiazem to 120 mg CD po daily.  Sent new order into pts pharmacy of choice.  Advised Husband and pt that per Dr Judd Gaudier home BP cuffs can be inaccurate with recording AFIB HR.  Pt and Husband both verbalized understanding and agree with this plan.

## 2014-02-28 NOTE — Telephone Encounter (Signed)
Decrease diltiazem to 120mg  CD QD.  Sometimes home blood pressure cuffs do not accurately record AFIB heart rate.  Thanks.

## 2014-03-10 ENCOUNTER — Encounter: Payer: Self-pay | Admitting: Physician Assistant

## 2014-03-10 ENCOUNTER — Ambulatory Visit (INDEPENDENT_AMBULATORY_CARE_PROVIDER_SITE_OTHER): Payer: Medicare Other | Admitting: Physician Assistant

## 2014-03-10 VITALS — BP 163/89 | HR 91 | Ht 64.0 in | Wt 204.0 lb

## 2014-03-10 DIAGNOSIS — I4891 Unspecified atrial fibrillation: Secondary | ICD-10-CM

## 2014-03-10 DIAGNOSIS — I10 Essential (primary) hypertension: Secondary | ICD-10-CM

## 2014-03-10 DIAGNOSIS — Z7901 Long term (current) use of anticoagulants: Secondary | ICD-10-CM

## 2014-03-10 MED ORDER — DILTIAZEM HCL ER COATED BEADS 180 MG PO CP24: 180.0000 mg | ORAL_CAPSULE | Freq: Every day | ORAL | Status: DC

## 2014-03-10 NOTE — Assessment & Plan Note (Signed)
Patient on Eliquis and tolerating well per

## 2014-03-10 NOTE — Patient Instructions (Signed)
Your physician recommends that you schedule a follow-up appointment in: 2 WEEKS WITH DR. Anne FuSKAINS  Your physician has recommended you make the following change in your medication:   INCREASE YOUR DILTIAZEM 180 MG ONCE A DAY

## 2014-03-10 NOTE — Assessment & Plan Note (Signed)
Blood pressure up today. She gets anxious when she comes to the doctor's office. Higher dose diltiazem may help with this.

## 2014-03-10 NOTE — Assessment & Plan Note (Addendum)
Patient has atrial fibrillation and rate is still not well controlled. She did not tolerate diltiazem 240 mg daily as it dropped her heart rate too low when she was fatigued. We walked the patient in the office and her heart rate got up to 112 beats per minute. I will increase it to 180 mg once daily in hopes that this will control her heart rate. If she doesn't tolerate it she'll have to be placed on a beta blocker. She is scheduled for 2-D echo on Wednesday. Followup with Dr. Anne FuSkains in 4 weeks.

## 2014-03-10 NOTE — Progress Notes (Signed)
HPI:  This is a 79 year old female patient of Dr. Anne Fu who he saw on 02/24/14 for newly discovered atrial fibrillation with rapid ventricular response. She was started on Eliquis 5 mg twice a day by Dr. Foy Guadalajara. Her CHADSVASc-4.Her rate was still fast so he increased her Cardizem to 240 mg daily. He also ordered a 2-D echo which is scheduled for this Wednesday. Patient's husband called in and said she was very fatigued and heart rates were in the high 40s or 50s on the blood pressure monitor. Her Cardizem was decreased to 120 mg daily. Patient is asymptomatic with the atrial fibrillation. She denies any palpitations or dyspnea. She does have chronic right lower extremity edema. She is starting to watch her sodium intake.  We walked the patient around the office with a heart rate monitor on and her heart rate got up to 112/m. Patient was asymptomatic with this.  Patient also has a history of chronic venous insufficiency and lower extremity edema, early onset dementia and hyperlipidemia with intolerance to statins.   Allergies -- Codeine    --  hallucinations  -- Penicillins -- Other (See Comments)   --  Yeast infection  Current Outpatient Prescriptions on File Prior to Visit: Acetaminophen (TYLENOL PO), Take by mouth. Prn, Disp: , Rfl:  apixaban (ELIQUIS) 5 MG TABS tablet, Take 5 mg by mouth 2 (two) times daily., Disp: , Rfl:  calcium carbonate (TUMS - DOSED IN MG ELEMENTAL CALCIUM) 500 MG chewable tablet, Chew 1 tablet by mouth as needed. , Disp: , Rfl:  Cholecalciferol (VITAMIN D3) 3000 UNITS TABS, Take by mouth daily., Disp: , Rfl:  Cyanocobalamin (VITAMIN B 12 PO), Take by mouth daily., Disp: , Rfl:  diltiazem (CARDIZEM CD) 120 MG 24 hr capsule, Take 1 capsule (120 mg total) by mouth daily., Disp: 90 capsule, Rfl: 3 donepezil (ARICEPT) 10 MG tablet, 1/2 tab po qday xone week, then one tab po qday., Disp: 30 tablet, Rfl: 12 lisinopril-hydrochlorothiazide (PRINZIDE,ZESTORETIC) 20-12.5 MG per  tablet, Take 1 tablet by mouth 2 (two) times daily. , Disp: , Rfl:  NON FORMULARY, allergy shots, Disp: , Rfl:  NON FORMULARY, Stool softner, Disp: , Rfl:  timolol (TIMOPTIC) 0.5 % ophthalmic solution, as needed. , Disp: , Rfl:   No current facility-administered medications on file prior to visit.   Past Medical History:   Hypertension                                                 Allergy                                                      Hyperlipidemia                                               Glaucoma  Memory loss                                                  Atrial fibrillation                                         Past Surgical History:   RETINAL DETACHMENT SURGERY                                    APPENDECTOMY                                     1974         CHOLECYSTECTOMY                                  2012        Review of patient's family history indicates:   Cancer                         Sister                     Comment: breast   Social History   Marital Status: Married             Spouse Name: Johnnie              Years of Education:                 Number of children: 1           Occupational History Occupation          Associate Professormployer            Comment                                                      retired  Social History Main Topics   Smoking Status: Never Smoker                     Smokeless Status: Never Used                       Alcohol Use: No             Drug Use: No             Sexual Activity: Not on file        Other Topics            Concern   None on file  Social History Narrative   Patient lives at home with her husband [name](Johhnie ) and [name].   Retired.   Caffeine- half cup of coffee.   Right handed.      ROS: See history of present illness otherwise negative   PHYSICAL  EXAM: Well-nournished, in no acute distress. Neck: No JVD, HJR, Bruit,  or thyroid enlargement  Lungs: No tachypnea, clear without wheezing, rales, or rhonchi  Cardiovascular: RRR, PMI not displaced, heart sounds normal, no murmurs, gallops, bruit, thrill, or heave.  Abdomen: BS normal. Soft without organomegaly, masses, lesions or tenderness.  Extremities: Chronic right lower extremity edema otherwise without cyanosis, clubbing. Good distal pulses bilateral  SKin: Warm, no lesions or rashes   Musculoskeletal: No deformities  Neuro: no focal signs  BP 163/89  Pulse 91  Ht 5\' 4"  (1.626 m)  Wt 204 lb (92.534 kg)  BMI 35.00 kg/m2    EKG: Atrial fibrillation 91 beats per minute with nonspecific ST-T wave changes

## 2014-03-12 ENCOUNTER — Ambulatory Visit (HOSPITAL_COMMUNITY): Payer: Medicare Other | Attending: Cardiology | Admitting: Radiology

## 2014-03-12 DIAGNOSIS — E669 Obesity, unspecified: Secondary | ICD-10-CM | POA: Insufficient documentation

## 2014-03-12 DIAGNOSIS — I4891 Unspecified atrial fibrillation: Secondary | ICD-10-CM

## 2014-03-12 DIAGNOSIS — R609 Edema, unspecified: Secondary | ICD-10-CM | POA: Insufficient documentation

## 2014-03-12 DIAGNOSIS — I10 Essential (primary) hypertension: Secondary | ICD-10-CM | POA: Insufficient documentation

## 2014-03-12 DIAGNOSIS — E785 Hyperlipidemia, unspecified: Secondary | ICD-10-CM | POA: Insufficient documentation

## 2014-03-12 NOTE — Progress Notes (Signed)
Echocardiogram performed.  

## 2014-03-24 ENCOUNTER — Ambulatory Visit (INDEPENDENT_AMBULATORY_CARE_PROVIDER_SITE_OTHER): Payer: Medicare Other | Admitting: Physician Assistant

## 2014-03-24 ENCOUNTER — Encounter: Payer: Self-pay | Admitting: Physician Assistant

## 2014-03-24 VITALS — BP 141/73 | HR 70 | Ht 64.0 in | Wt 201.8 lb

## 2014-03-24 DIAGNOSIS — I10 Essential (primary) hypertension: Secondary | ICD-10-CM

## 2014-03-24 DIAGNOSIS — I4891 Unspecified atrial fibrillation: Secondary | ICD-10-CM

## 2014-03-24 DIAGNOSIS — I4819 Other persistent atrial fibrillation: Secondary | ICD-10-CM

## 2014-03-24 NOTE — Assessment & Plan Note (Signed)
Patient's atrial fibrillation is better controlled on diltiazem 180 mg once daily. Continue this dose. She is also on Eliquis 5 mg twice a day.

## 2014-03-24 NOTE — Assessment & Plan Note (Signed)
Blood pressure better controlled. Continue 2 g sodium diet. Patient complains of taking hydrochlorothiazide twice a day as she is up all night urinating. I told her we could separate the lisinopril and HCTZ, and take the HCTZ all in the morning. She said she will think about this.

## 2014-03-24 NOTE — Progress Notes (Signed)
HPI:  This is a 79 year old female patient Dr. Chales Abrahamsskeins who he saw in 02/24/14 for newly discovered atrial fibrillation with rapid ventricular response. She was started on Eliquis 5 mg twice a day. CHADSVASc-4. He increased her Cardizem to 240 mg daily but she did not tolerate this. He was decreased to 120 mg daily. When I saw her her heart rate was still high at 112 beats per minute and I increased her to 180 mg once daily. 2-D echo 03/12/14 showed normal LV function EF 65-70% left atrium was moderately dilated and systolic pressure was mildly increased PA pressure 40 mm mercury.  The patient feels much better on her current dose Cardizem. She's had no fast or slow heart rates. Her blood pressure seems a little better controlled.  Allergies -- Codeine    --  hallucinations  -- Penicillins -- Other (See Comments)   --  Yeast infection  Current Outpatient Prescriptions on File Prior to Visit: Acetaminophen (TYLENOL PO), Take by mouth. Prn, Disp: , Rfl:  apixaban (ELIQUIS) 5 MG TABS tablet, Take 5 mg by mouth 2 (two) times daily., Disp: , Rfl:  calcium carbonate (TUMS - DOSED IN MG ELEMENTAL CALCIUM) 500 MG chewable tablet, Chew 1 tablet by mouth as needed. , Disp: , Rfl:  Cholecalciferol (VITAMIN D3) 3000 UNITS TABS, Take by mouth daily., Disp: , Rfl:  Cyanocobalamin (VITAMIN B 12 PO), Take by mouth daily., Disp: , Rfl:  diltiazem (CARDIZEM CD) 180 MG 24 hr capsule, Take 1 capsule (180 mg total) by mouth daily., Disp: 30 capsule, Rfl: 3 donepezil (ARICEPT) 10 MG tablet, 1/2 tab po qday xone week, then one tab po qday., Disp: 30 tablet, Rfl: 12 lisinopril-hydrochlorothiazide (PRINZIDE,ZESTORETIC) 20-12.5 MG per tablet, Take 1 tablet by mouth 2 (two) times daily. , Disp: , Rfl:  NON FORMULARY, every 14 (fourteen) days. allergy shots, Disp: , Rfl:  NON FORMULARY, Stool softner, Disp: , Rfl:  timolol (TIMOPTIC) 0.5 % ophthalmic solution, as needed. , Disp: , Rfl:   No current facility-administered  medications on file prior to visit.   Past Medical History:   Hypertension                                                 Allergy                                                      Hyperlipidemia                                               Glaucoma                                                     Memory loss  Atrial fibrillation                                         Past Surgical History:   RETINAL DETACHMENT SURGERY                                    APPENDECTOMY                                     1974         CHOLECYSTECTOMY                                  2012        Review of patient's family history indicates:   Cancer                         Sister                     Comment: breast   Social History   Marital Status: Married             Spouse Name: Johnnie              Years of Education:                 Number of children: 1           Occupational History Occupation          Associate Professormployer            Comment                                                      retired  Social History Main Topics   Smoking Status: Never Smoker                     Smokeless Status: Never Used                       Alcohol Use: No             Drug Use: No             Sexual Activity: Not on file        Other Topics            Concern   None on file  Social History Narrative   Patient lives at home with her husband Savannah Hinton(Savannah Hinton ) and her son was with her also Savannah Huaavid.   Retired.   Caffeine- half cup of coffee.   Right handed.      ROS: See history of present illness otherwise negative   PHYSICAL EXAM: Well-nournished, in no acute distress. Neck: No JVD, HJR, Bruit, or thyroid enlargement  Lungs: No tachypnea, clear without wheezing, rales, or rhonchi  Cardiovascular: Irregular irregular, PMI not displaced, heart sounds distant, no murmurs, gallops, bruit, thrill, or heave.  Abdomen: BS normal. Soft without organomegaly,  masses, lesions  or tenderness.  Extremities: Trace of right lower extremity edema otherwise lower sternum these without cyanosis, clubbing. Good distal pulses bilateral  SKin: Warm, no lesions or rashes   Musculoskeletal: No deformities  Neuro: no focal signs  BP 141/73  Pulse 70  Ht 5\' 4"  (1.626 m)  Wt 201 lb 12.8 oz (91.536 kg)  BMI 34.62 kg/m2    EKG: Atrial fibrillation at 70 beats per minute with nonspecific ST-T wave changes 2-D echo cyst 03/12/14 Study Conclusions  - Left ventricle: The cavity size was normal. Systolic function was   vigorous. The estimated ejection fraction was in the range of 65%   to 70%. Wall motion was normal; there were no regional wall   motion abnormalities. - Left atrium: The atrium was moderately dilated. - Right atrium: The atrium was mildly dilated. - Pulmonary arteries: Systolic pressure was mildly increased. PA   peak pressure: 40 mm Hg (S).

## 2014-03-24 NOTE — Patient Instructions (Signed)
Your physician recommends that you continue on your current medications as directed. Please refer to the Current Medication list given to you today.  NO CHANGES TODAY

## 2014-04-15 ENCOUNTER — Ambulatory Visit: Payer: Medicare Other | Admitting: Nurse Practitioner

## 2014-04-30 ENCOUNTER — Encounter: Payer: Self-pay | Admitting: Nurse Practitioner

## 2014-04-30 ENCOUNTER — Ambulatory Visit (INDEPENDENT_AMBULATORY_CARE_PROVIDER_SITE_OTHER): Payer: Medicare Other | Admitting: Nurse Practitioner

## 2014-04-30 VITALS — BP 159/84 | HR 91 | Ht 65.0 in | Wt 202.0 lb

## 2014-04-30 DIAGNOSIS — R4189 Other symptoms and signs involving cognitive functions and awareness: Secondary | ICD-10-CM

## 2014-04-30 DIAGNOSIS — F09 Unspecified mental disorder due to known physiological condition: Secondary | ICD-10-CM

## 2014-04-30 DIAGNOSIS — R413 Other amnesia: Secondary | ICD-10-CM

## 2014-04-30 NOTE — Patient Instructions (Signed)
Continue Aricept at current dose Memory score is stable Follow up 6 months

## 2014-04-30 NOTE — Progress Notes (Signed)
[facility]  PATIENT: Savannah Hinton M Brander DOB: 1935-08-03   REASON FOR VISIT: follow up for memory loss     HISTORY OF PRESENT ILLNESS: Savannah Hinton is a 79 years old right-handed Caucasian female, referred by her primary care physician Dr. Marinda Elkobert Fried, for evaluation of memory loss. She is accompanied by her son, and her husband at today's clinical visit.  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  Since 2013, she was noticed to have mild word finding difficulties, memory trouble, difficulty working with members, difficulty to balance her checkbook, she denied visual loss, she has good appetite, sleeping well, she has occasionally diarrhea with greasy food.  Her word finding difficulty has gotten worse over the past 3 months, herself does not agree that she has significant memory trouble  Both of her parents died of stroke at age 2470s, her older sister died of Alzheimer's at age 77  UPDATE April 30, 2014. Savannah Hinton, 79 year old female returns for followup.She is again accompanied by her husband. She and her husband feel her  memory trouble is stable,  MRI of the brain demonstrated moderate atrophy, mainly involving bilateral frontal, parietal, temporal region, mild small vessel disease, laboratory showed low normal B12, 220, rest of the laboratory was normal including RPR, TSH, ANA, CMP with exception of mild elevated glucose 130, She is taking B12 supplement by mouth. She does no regular exercise. Appetite is reportedly good she is sleeping well. She denies side effects to Aricept . She returns for reevaluation    REVIEW OF SYSTEMS: Full 14 system review of systems performed and notable only for those listed, all others are neg:  Constitutional: N/A  Cardiovascular: N/A  Ear/Nose/Throat: N/A  Skin: N/A  Eyes: N/A  Respiratory: N/A  Gastroitestinal: N/A    Hematology/Lymphatic: N/A  Endocrine: N/A Musculoskeletal:N/A  Allergy/Immunology: N/A  Neurological: N/A Psychiatric: N/A Sleep : NA   ALLERGIES: Allergies  Allergen Reactions  . Codeine     hallucinations  . Penicillins Other (See Comments)    Yeast infection     HOME MEDICATIONS: Outpatient Prescriptions Prior to Visit  Medication Sig Dispense Refill  . apixaban (ELIQUIS) 5 MG TABS tablet Take 5 mg by mouth 2 (two) times daily.      . Cyanocobalamin (VITAMIN B 12 PO) Take by mouth daily.      Marland Kitchen. diltiazem (CARDIZEM CD) 180 MG 24 hr capsule Take 1 capsule (180 mg total) by mouth daily.  30 capsule  3  . donepezil (ARICEPT) 10 MG tablet 1/2 tab po qday xone week, then one tab po qday.  30 tablet  12  . lisinopril-hydrochlorothiazide (PRINZIDE,ZESTORETIC) 20-12.5 MG per tablet Take 1 tablet by mouth 2 (two) times daily.       . NON FORMULARY every 14 (fourteen) days. allergy shots      . timolol (TIMOPTIC) 0.5 % ophthalmic solution as needed.       . Acetaminophen (TYLENOL PO) Take 500 mg by mouth. Prn      . calcium carbonate (TUMS - DOSED IN MG ELEMENTAL CALCIUM) 500 MG chewable tablet Chew 1 tablet by mouth as needed.       . Cholecalciferol (VITAMIN D3) 3000 UNITS TABS Take by mouth daily.      . NON FORMULARY Stool softner       No facility-administered medications prior to visit.    PAST MEDICAL HISTORY: Past Medical History  Diagnosis Date  . Hypertension   . Allergy   . Hyperlipidemia   . Glaucoma   . Memory loss   . Atrial fibrillation     PAST SURGICAL HISTORY: Past Surgical History  Procedure Laterality Date  . Retinal detachment surgery    . Appendectomy  1974  . Cholecystectomy  2012    FAMILY HISTORY: Family History  Problem Relation Age of Onset  . Cancer Sister     breast    SOCIAL HISTORY: History   Social History  . Marital Status: Married    Spouse Name: Johnnie    Number of Children: 1  . Years of Education: N/A    Occupational History  .      retired   Social History Main Topics  . Smoking status: Never Smoker   . Smokeless tobacco: Never Used  . Alcohol Use: No  . Drug Use: No  . Sexual Activity: Not on file   Other Topics Concern  . Not on file   Social History Narrative   Patient lives at home with her husband [name] ) and her son was with her also Onalee Hua.   Retired.   Caffeine- half cup of coffee.   Right handed.           PHYSICAL EXAM  Filed Vitals:   04/30/14 1355  BP: 159/84  Pulse: 91  Height: 5\' 5"  (1.651 m)  Weight: 202 lb (91.627 kg)   Body mass index is 33.61 kg/(m^2). Generalized: In no acute distress  Neck: Supple, no carotid bruits  Musculoskeletal: No deformity  Neurological examination  Mentation:  MMSE 30/30, AFT 7. Follows all commands Cranial nerve II-XII: Pupils were equal round reactive to light extraocular movements were full, visual field were full on confrontational test. facial sensation and strength were normal. hearing was intact to finger rubbing bilaterally. Uvula tongue midline. head turning and shoulder shrug and were normal and symmetric.Tongue protrusion into cheek strength was normal.  Motor: normal tone, bulk and strength.  Sensory: Intact to fine touch, pinprick, preserved vibratory sensation, and proprioception at toes.  Coordination: Normal finger to nose, heel-to-shin bilaterally there was no truncal ataxia  Gait: Rising up from seated position without assistance, normal stance, without trunk ataxia, moderate stride, good arm swing, smooth turning, able to perform tiptoe, and heel walking without difficulty.  Romberg signs: Negative  Deep tendon reflexes: Brachioradialis 2/2, biceps 2/2, triceps 2/2, patellar 2/2, Achilles 2/2, plantar responses were flexor bilaterally.      DIAGNOSTIC DATA (LABS, IMAGING, TESTING) - I reviewed patient records, labs, notes, testing and imaging myself where available.  Lab Results  Component  Value Date   WBC 7.7 07/11/2013   HGB 14.5 07/11/2013   HCT 41.5 07/11/2013   MCV 86 07/11/2013   PLT 185 07/11/2013      Component Value Date/Time   NA 142 07/11/2013 1316   NA 138 12/26/2011 1854   K 3.5 07/11/2013 1316   CL 98 07/11/2013 1316   CO2 28 07/11/2013 1316   GLUCOSE 131* 07/11/2013 1316   GLUCOSE 110* 12/26/2011 1854   BUN 20 07/11/2013 1316   BUN 23 12/26/2011 1854   CREATININE 0.91 07/11/2013 1316   CALCIUM 9.6 07/11/2013 1316   PROT 7.0 07/11/2013 1316   PROT 7.5 10/03/2009 0904   ALBUMIN 4.2 10/03/2009 0904   AST 13 07/11/2013 1316   ALT 13 07/11/2013 1316   ALKPHOS 66 07/11/2013 1316   BILITOT 0.4 07/11/2013 1316   GFRNONAA 62  07/11/2013 1316   GFRAA 71 07/11/2013 1316    Lab Results  Component Value Date   VITAMINB12 232 07/11/2013   Lab Results  Component Value Date   TSH 1.700 07/11/2013      ASSESSMENT AND PLAN  79 y.o. year old female  has a past medical history of Hypertension;  Hyperlipidemia;  Memory loss; and Atrial fibrillation. here to followup for memory loss. Memory score is stable. Currently on Aricept 10 mg daily.  Continue Aricept at current dose Follow up 6 months Nilda Riggs, [facility], [facility], APRN  [facility] [address] [place], [place] [address] [phone]

## 2014-05-07 ENCOUNTER — Encounter: Payer: Self-pay | Admitting: Cardiology

## 2014-05-07 ENCOUNTER — Ambulatory Visit (INDEPENDENT_AMBULATORY_CARE_PROVIDER_SITE_OTHER): Payer: Medicare Other | Admitting: Cardiology

## 2014-05-07 VITALS — BP 138/88 | HR 62 | Ht 65.0 in | Wt 202.0 lb

## 2014-05-07 DIAGNOSIS — I4891 Unspecified atrial fibrillation: Secondary | ICD-10-CM

## 2014-05-07 DIAGNOSIS — Z79899 Other long term (current) drug therapy: Secondary | ICD-10-CM

## 2014-05-07 DIAGNOSIS — I10 Essential (primary) hypertension: Secondary | ICD-10-CM

## 2014-05-07 LAB — BASIC METABOLIC PANEL
BUN: 23 mg/dL (ref 6–23)
CALCIUM: 9.6 mg/dL (ref 8.4–10.5)
CO2: 31 mEq/L (ref 19–32)
Chloride: 102 mEq/L (ref 96–112)
Creatinine, Ser: 1 mg/dL (ref 0.4–1.2)
GFR: 55.88 mL/min — AB (ref >60.00)
GLUCOSE: 98 mg/dL (ref 70–99)
Potassium: 4.6 mEq/L (ref 3.5–5.1)
Sodium: 140 mEq/L (ref 135–145)

## 2014-05-07 LAB — CBC
HEMATOCRIT: 42.5 % (ref 36.0–46.0)
Hemoglobin: 14 g/dL (ref 12.0–15.0)
MCHC: 33 g/dL (ref 30.0–36.0)
MCV: 85.8 fl (ref 78.0–100.0)
Platelets: 228 10*3/uL (ref 150.0–400.0)
RBC: 4.95 Mil/uL (ref 3.87–5.11)
RDW: 15.2 % (ref 11.5–15.5)
WBC: 7.5 10*3/uL (ref 4.0–10.5)

## 2014-05-07 MED ORDER — LISINOPRIL 20 MG PO TABS: 20.0000 mg | ORAL_TABLET | Freq: Every day | ORAL | Status: DC

## 2014-05-07 MED ORDER — LISINOPRIL 20 MG PO TABS
20.0000 mg | ORAL_TABLET | Freq: Two times a day (BID) | ORAL | Status: DC
Start: 2014-05-07 — End: 2015-03-31

## 2014-05-07 MED ORDER — HYDROCHLOROTHIAZIDE 25 MG PO TABS: 25.0000 mg | ORAL_TABLET | Freq: Every day | ORAL | Status: DC

## 2014-05-07 NOTE — Progress Notes (Signed)
[address] [place], [place]  [address] Phone: [phone] Fax:  [phone]) [phone]  Date:  05/07/2014   ID:  Savannah GumVivian M Platt, DOB 12/10/1934, MRN [id number]  PCP:  Lenora BoysFRIED, ROBERT L, MD   History of Present Illness: Savannah Hinton is a 79 y.o. female here for followup atrial fibrillation. Prior rapid ventricular response discovered 02/20/14. Early-onset dementia. Eliquis 5 twice a day. Statin myalgias. TSH normal. Negative DVT. Prior creatinine 1.1. Urinary issues after going to sleep. Could be HCTZ. Potassium has been slightly low in the past.   Wt Readings from Last 3 Encounters:  05/07/14 202 lb (91.627 kg)  04/30/14 202 lb (91.627 kg)  03/24/14 201 lb 12.8 oz (91.536 kg)     Past Medical History  Diagnosis Date  . Hypertension   . Allergy   . Hyperlipidemia   . Glaucoma   . Memory loss   . Atrial fibrillation     Past Surgical History  Procedure Laterality Date  . Retinal detachment surgery    . Appendectomy  1974  . Cholecystectomy  2012    Current Outpatient Prescriptions  Medication Sig Dispense Refill  . Acetaminophen (TYLENOL PO) Take 500 mg by mouth. Prn      . apixaban (ELIQUIS) 5 MG TABS tablet Take 5 mg by mouth 2 (two) times daily.      . Cholecalciferol (VITAMIN D3) 3000 UNITS TABS Take 3,000 Units by mouth daily.      . Cyanocobalamin (VITAMIN B 12 PO) Take by mouth daily.      Marland Kitchen. diltiazem (CARDIZEM CD) 180 MG 24 hr capsule Take 1 capsule (180 mg total) by mouth daily.  30 capsule  3  . donepezil (ARICEPT) 10 MG tablet 1/2 tab po qday xone week, then one tab po qday.  30 tablet  12  . lisinopril-hydrochlorothiazide (PRINZIDE,ZESTORETIC) 20-12.5 MG per tablet Take 1 tablet by mouth 2 (two) times daily.       . NON FORMULARY every 14 (fourteen) days. allergy shots      . timolol (TIMOPTIC) 0.5 % ophthalmic solution as needed.        No current facility-administered medications for this visit.    Allergies:    Allergies  Allergen  Reactions  . Codeine     hallucinations  . Penicillins Other (See Comments)    Yeast infection     Social History:  The patient  reports that she has never smoked. She has never used smokeless tobacco. She reports that she does not drink alcohol or use illicit drugs.   Family History  Problem Relation Age of Onset  . Cancer Sister     breast    ROS:  Please see the history of present illness.   Denies any syncope, bleeding, orthopnea, PND   All other systems reviewed and negative.   PHYSICAL EXAM: VS:  BP 138/88  Pulse 62  Ht 5\' 5"  (1.651 m)  Wt 202 lb (91.627 kg)  BMI 33.61 kg/m2  SpO2 99% Well nourished, well developed, in no acute distress HEENT: normal, [place]/AT, EOMI Neck: no JVD, normal carotid upstroke, no bruit Cardiac:  normal S1, S2; RRR; no murmur Lungs:  clear to auscultation bilaterally, no wheezing, rhonchi or rales Abd: soft, nontender, no hepatomegaly, no bruits Ext: right 2+ edema, 2+ distal pulses Skin: warm and dry GU: deferred Neuro: no focal abnormalities noted, AAO x 3, memory impairment  EKG:  None today    Echocardiogram:  -  Left ventricle: The cavity size was normal. Systolic function was vigorous. The estimated ejection fraction was in the range of 65% to 70%. Wall motion was normal; there were no regional wall motion abnormalities. - Left atrium: The atrium was moderately dilated. - Right atrium: The atrium was mildly dilated. - Pulmonary arteries: Systolic pressure was mildly increased. PA peak pressure: 40 mm Hg (S).   ASSESSMENT AND PLAN:  1. Chronic atrial fibrillation-diltiazem CD as above. Doing well. Rate controlled. Reassuring echocardiogram. 2. Chronic anticoagulation-I will check the, a segment about profile every 6 months. Eliquis. Tolerating well. No bleeding. 3. Hypertension-she likes to take her lisinopril in a twice a day fashion to prevent increases in blood pressure in the late evening. She has noted urinary issues shortly  after going to sleep. Because of this, I will place her on her HCTZ at 25 mg in the morning hours and give her lisinopril 20 mg twice a day. 4. We will see her back in 6 months.  Signed, Donato Schultz, MD [facility]  05/07/2014 10:23 AM

## 2014-05-07 NOTE — Patient Instructions (Signed)
Please stop your combination Lisinopril/HCTZ. Start Lisinopril 20 mg twice a day and HCTZ 25 mg every AM. Continue all other medications as listed.  Please have blood work today (CBC,BMP)  Follow up in 6 months with Dr Anne FuSkains.  You will receive a letter in the mail 2 months before you are due.  Please call us when you receive this letter to schedule your follow up appointment.

## 2014-07-01 ENCOUNTER — Other Ambulatory Visit: Payer: Self-pay | Admitting: Physician Assistant

## 2014-07-02 ENCOUNTER — Other Ambulatory Visit: Payer: Self-pay

## 2014-07-02 MED ORDER — DILTIAZEM HCL ER COATED BEADS 180 MG PO CP24: 180.0000 mg | ORAL_CAPSULE | Freq: Every day | ORAL | Status: DC

## 2014-11-03 ENCOUNTER — Ambulatory Visit (INDEPENDENT_AMBULATORY_CARE_PROVIDER_SITE_OTHER): Payer: BLUE CROSS/BLUE SHIELD | Admitting: Nurse Practitioner

## 2014-11-03 ENCOUNTER — Encounter: Payer: Self-pay | Admitting: Nurse Practitioner

## 2014-11-03 VITALS — BP 150/88 | HR 82 | Ht 65.0 in | Wt 205.0 lb

## 2014-11-03 DIAGNOSIS — R413 Other amnesia: Secondary | ICD-10-CM | POA: Diagnosis not present

## 2014-11-03 DIAGNOSIS — R4189 Other symptoms and signs involving cognitive functions and awareness: Secondary | ICD-10-CM | POA: Diagnosis not present

## 2014-11-03 MED ORDER — DONEPEZIL HCL 23 MG PO TABS: 23.0000 mg | ORAL_TABLET | Freq: Every day | ORAL | Status: DC

## 2014-11-03 NOTE — Patient Instructions (Addendum)
Increase Aricept to 23 mg daily, will refill F/U in 6 months

## 2014-11-03 NOTE — Progress Notes (Signed)
[facility]  PATIENT: Savannah GumVivian M Hinton DOB: Feb 08, 1935   REASON FOR VISIT: follow up for memory loss HISTORY FROM:patient and husband    HISTORY OF PRESENT ILLNESS:Savannah Hinton is a 80 years old right-handed Caucasian female, referred by her primary care physician Dr. Marinda Elkobert Fried, for evaluation of memory loss. She is accompanied by her son, and her husband at today's clinical visit.  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  Since 2013, she was noticed to have mild word finding difficulties, memory trouble, difficulty working with members, difficulty to balance her checkbook, she denied visual loss, she has good appetite, sleeping well, she has occasionally diarrhea with greasy food.  Her word finding difficulty has gotten worse over the past 3 months, herself does not agree that she has significant memory trouble  Both of her parents died of stroke at age 3370s, her older sister died of Alzheimer's at age 78  UPDATE November 03, 2014. Savannah Hinton, 80 year old female returns for followup.She is again accompanied by her husband. She was last seen 04/30/14. She and her husband feel her memory trouble has worsened since last seen, she is wanting to increase her Aricept dose. She denies missing any doses of the medication. , MRI of the brain demonstrated moderate atrophy, mainly involving bilateral frontal, parietal, temporal region, mild small vessel disease, laboratory showed low normal B12, 220, rest of the laboratory was normal including RPR, TSH, ANA, CMP with exception of mild elevated glucose 130, She is taking B12 supplement by mouth. She does no regular exercise. Appetite is reportedly good she is sleeping well. She denies side effects to Aricept . She continues to drive short distances with out getting lost. No longer cooks. Independent in dressing bathing and eating.  She returns for reevaluation    REVIEW OF SYSTEMS: Full 14 system review of systems performed and notable only for those listed, all others are neg:  Constitutional: neg  Cardiovascular: neg Ear/Nose/Throat: neg  Skin: neg Eyes: neg Respiratory: neg Gastroitestinal: neg  Hematology/Lymphatic: neg  Endocrine: neg Musculoskeletal:neg Allergy/Immunology: neg Neurological: memory Psychiatric: neg Sleep : neg   ALLERGIES: Allergies  Allergen Reactions  . Codeine     hallucinations  . Penicillins Other (See Comments)    Yeast infection     HOME MEDICATIONS: Outpatient Prescriptions Prior to Visit  Medication Sig Dispense Refill  . Acetaminophen (TYLENOL PO) Take 500 mg by mouth. Prn    . apixaban (ELIQUIS) 5 MG TABS tablet Take 5 mg by mouth 2 (two) times daily.    . Cholecalciferol (VITAMIN D3) 3000 UNITS TABS Take 3,000 Units by mouth daily.    . Cyanocobalamin (VITAMIN B 12 PO) Take by mouth daily.    Marland Kitchen. diltiazem (CARDIZEM CD) 180 MG 24 hr capsule Take 1 capsule (180 mg total) by mouth daily. 30 capsule 6  . donepezil (ARICEPT) 10 MG tablet 1/2 tab po qday xone week, then one tab po qday. 30 tablet 12  . hydrochlorothiazide (HYDRODIURIL) 25 MG tablet Take 1 tablet (25 mg total) by mouth daily. 30 tablet 11  . lisinopril (PRINIVIL,ZESTRIL) 20 MG tablet Take 1 tablet (20 mg total) by mouth 2 (two) times daily. 60 tablet 11  . NON FORMULARY every 14 (fourteen) days. allergy shots    . timolol (TIMOPTIC) 0.5 % ophthalmic solution as needed.      No facility-administered medications prior to visit.    PAST MEDICAL  HISTORY: Past Medical History  Diagnosis Date  . Hypertension   . Allergy   . Hyperlipidemia   . Glaucoma   . Memory loss   . Atrial fibrillation     PAST SURGICAL HISTORY: Past Surgical History  Procedure Laterality Date  . Retinal detachment surgery    . Appendectomy  1974  . Cholecystectomy  2012    FAMILY HISTORY: Family History  Problem  Relation Age of Onset  . Cancer Sister     breast    SOCIAL HISTORY: History   Social History  . Marital Status: Married    Spouse Name: Johnnie    Number of Children: 1  . Years of Education: N/A   Occupational History  .      retired   Social History Main Topics  . Smoking status: Never Smoker   . Smokeless tobacco: Never Used  . Alcohol Use: No  . Drug Use: No  . Sexual Activity: Not on file   Other Topics Concern  . Not on file   Social History Narrative   Patient lives at home with her husband [name] ) and her son was with her also Onalee Hua.   Retired.   Caffeine- half cup of coffee.   Right handed.           PHYSICAL EXAM  Filed Vitals:   11/03/14 1338  BP: 150/88  Pulse: 82  Height:  (1.651 m)  Weight: 205 lb (92.987 kg)   Body mass index is 34.11 kg/(m^2).  Generalized: Well developed,obese female  in no acute distress  Head: normocephalic and atraumatic,. Oropharynx benign  Neck: Supple, no carotid bruits  Cardiac: Regular rate rhythm, no murmur  Musculoskeletal: No deformity   Neurological examination   Mentation: Alert oriented to time, place, history taking. MMSE 25/30 missing items in attention,  recall and unable to copy a design.   Follows all commands speech and language fluent.   Cranial nerve II-XII: Fundoscopic exam reveals sharp disc margins.Pupils were equal round reactive to light extraocular movements were full, visual field were full on confrontational test. Facial sensation and strength were normal. hearing was intact to finger rubbing bilaterally. Uvula tongue midline. head turning and shoulder shrug were normal and symmetric.Tongue protrusion into cheek strength was normal. Motor: normal bulk and tone, full strength in the BUE, BLE, fine finger movements normal, no pronator drift. No focal weakness Sensory: normal and symmetric to light touch, pinprick, and  Vibration, proprioception  Coordination: finger-nose-finger,  heel-to-shin bilaterally, no dysmetria Reflexes: Brachioradialis 2/2, biceps 2/2, triceps 2/2, patellar 2/2, Achilles 2/2, plantar responses were flexor bilaterally. Gait and Station: Rising up from seated position without assistance, normal stance,  moderate stride, good arm swing, smooth turning, able to perform tiptoe, and heel walking without difficulty. Tandem gait is mildly unsteady. No assistive device.  DIAGNOSTIC DATA (LABS, IMAGING, TESTING) - I reviewed patient records, labs, notes, testing and imaging myself where available.  Lab Results  Component Value Date   WBC 7.5 05/07/2014   HGB 14.0 05/07/2014   HCT 42.5 05/07/2014   MCV 85.8 05/07/2014   PLT 228.0 05/07/2014      Component Value Date/Time   NA 140 05/07/2014 1052   NA 142 07/11/2013 1316   K 4.6 05/07/2014 1052   CL 102 05/07/2014 1052   CO2 31 05/07/2014 1052   GLUCOSE 98 05/07/2014 1052   GLUCOSE 131* 07/11/2013 1316   BUN 23 05/07/2014 1052   BUN 20 07/11/2013 1316  CREATININE 1.0 05/07/2014 1052   CALCIUM 9.6 05/07/2014 1052   PROT 7.0 07/11/2013 1316   PROT 7.5 10/03/2009 0904   ALBUMIN 4.2 10/03/2009 0904   AST 13 07/11/2013 1316   ALT 13 07/11/2013 1316   ALKPHOS 66 07/11/2013 1316   BILITOT 0.4 07/11/2013 1316   GFRNONAA 62 07/11/2013 1316   GFRAA 71 07/11/2013 1316       ASSESSMENT AND PLAN  80 y.o. year old female  has a past medical history of Hypertension; Allergy; Hyperlipidemia; Glaucoma; Memory loss; and Atrial fibrillation. hereto follow up. Memory score has declined by 5 points. Currently on Aricept at  daily   Increase Aricept to 23 mg daily, will refill Given list of activities to stimulate the mind such as crosswords, strategy games, etc.  Exercise by walking for overall health and well being  F/U in 6 months Nilda Riggs, [facility], [facility], APRN  [facility] [address], [address] [place], [place] [address] [phone]

## 2014-11-05 ENCOUNTER — Encounter: Payer: Self-pay | Admitting: Cardiology

## 2014-11-05 ENCOUNTER — Ambulatory Visit (INDEPENDENT_AMBULATORY_CARE_PROVIDER_SITE_OTHER): Payer: Medicare Other | Admitting: Cardiology

## 2014-11-05 VITALS — BP 140/98 | HR 88 | Ht 65.0 in | Wt 205.0 lb

## 2014-11-05 DIAGNOSIS — Z7901 Long term (current) use of anticoagulants: Secondary | ICD-10-CM

## 2014-11-05 DIAGNOSIS — I482 Chronic atrial fibrillation, unspecified: Secondary | ICD-10-CM

## 2014-11-05 DIAGNOSIS — I10 Essential (primary) hypertension: Secondary | ICD-10-CM

## 2014-11-05 NOTE — Progress Notes (Signed)
[address] [place], [place]  [address] Phone: [phone] Fax:  [phone]  Date:  11/05/2014   ID:  Savannah Hinton, DOB 1935/01/12, MRN [id number]  PCP:  Savannah Boys, MD   History of Present Illness: Savannah Hinton is a 80 y.o. female here for followup atrial fibrillation. Prior rapid ventricular response discovered 02/20/14. Early-onset dementia. Savannah Hinton 5 twice a day. Statin myalgias. TSH normal. Negative DVT. Prior creatinine 1.1. Urinary issues after going to sleep.  Overall she is doing very well. No complaints. Her grandson just got married. No bleeding, no syncope.  Wt Readings from Last 3 Encounters:  11/05/14 205 lb (92.987 kg)  11/03/14 205 lb (92.987 kg)  05/07/14 202 lb (91.627 kg)     Past Medical History  Diagnosis Date  . Hypertension   . Allergy   . Hyperlipidemia   . Glaucoma   . Memory loss   . Atrial fibrillation     Past Surgical History  Procedure Laterality Date  . Retinal detachment surgery    . Appendectomy  1974  . Cholecystectomy  2012    Current Outpatient Prescriptions  Medication Sig Dispense Refill  . Acetaminophen (TYLENOL PO) Take 500 mg by mouth. Prn    . apixaban (Savannah Hinton) 5 MG TABS tablet Take 5 mg by mouth 2 (two) times daily.    . Cholecalciferol (VITAMIN D3) 3000 UNITS TABS Take 3,000 Units by mouth daily.    . Cyanocobalamin (VITAMIN B 12 PO) Take by mouth daily.    Marland Kitchen diltiazem (CARDIZEM CD) 180 MG 24 hr capsule Take 1 capsule (180 mg total) by mouth daily. 30 capsule 6  . donepezil (ARICEPT) 23 MG TABS tablet Take 1 tablet (23 mg total) by mouth daily. 30 tablet 6  . hydrochlorothiazide (HYDRODIURIL) 25 MG tablet Take 1 tablet (25 mg total) by mouth daily. 30 tablet 11  . lisinopril (PRINIVIL,ZESTRIL) 20 MG tablet Take 1 tablet (20 mg total) by mouth 2 (two) times daily. 60 tablet 11  . NON FORMULARY every 14 (fourteen) days. allergy shots    . timolol (TIMOPTIC) 0.5 % ophthalmic solution as  needed.      No current facility-administered medications for this visit.    Allergies:    Allergies  Allergen Reactions  . Codeine     hallucinations  . Penicillins Other (See Comments)    Yeast infection     Social History:  The patient  reports that she has never smoked. She has never used smokeless tobacco. She reports that she does not drink alcohol or use illicit drugs.   Family History  Problem Relation Age of Onset  . Cancer Sister     breast  . Stroke Mother   . Stroke Father   . Heart attack Paternal Uncle     ROS:  Please see the history of present illness.   Denies any syncope, bleeding, orthopnea, PND   All other systems reviewed and negative.   PHYSICAL EXAM: VS:  BP 140/98 mmHg  Pulse 88  Ht  (1.651 m)  Wt 205 lb (92.987 kg)  BMI 34.11 kg/m2 Well nourished, well developed, in no acute distress HEENT: normal, [place]/AT, EOMI Neck: no JVD, normal carotid upstroke, no bruit Cardiac:  normal S1, S2; RRR; no murmur Lungs:  clear to auscultation bilaterally, no wheezing, rhonchi or rales Abd: soft, nontender, no hepatomegaly, no bruits Ext: right 2+ edema, 2+ distal pulses Skin: warm and dry GU:  deferred Neuro: no focal abnormalities noted, AAO x 3, memory impairment  EKG:  None today    Echocardiogram:  - Left ventricle: The cavity size was normal. Systolic function was vigorous. The estimated ejection fraction was in the range of 65% to 70%. Wall motion was normal; there were no regional wall motion abnormalities. - Left atrium: The atrium was moderately dilated. - Right atrium: The atrium was mildly dilated. - Pulmonary arteries: Systolic pressure was mildly increased. PA peak pressure: 40 mm Hg (S).   ASSESSMENT AND PLAN:  1. Chronic atrial fibrillation-diltiazem CD as above. Doing well. Rate controlled. Reassuring echocardiogram. 2. Chronic anticoagulation- doing very well on Savannah Hinton. Tolerating well. No bleeding.  Savannah Hinton has been checking  blood work every 6 months. Excellent. 3. Hypertension- essential- diastolic is elevated today. Usually she states that at home her blood pressure is normal. She often will get escalation of blood pressure at doctor's office. Previously we have made changes. Continue with current medication strategy. 4. We will see her back in 12 months.  Signed, Savannah SchultzMark Skains, MD [facility]  11/05/2014 3:29 PM

## 2014-11-05 NOTE — Progress Notes (Signed)
I have reviewed and agreed above plan. 

## 2014-11-05 NOTE — Patient Instructions (Signed)
Your physician recommends that you continue on your current medications as directed. Please refer to the Current Medication list given to you today.  Your physician wants you to follow-up in: 1 year with Dr. Skains. You will receive a reminder letter in the mail two months in advance. If you don't receive a letter, please call our office to schedule the follow-up appointment.  

## 2014-11-17 ENCOUNTER — Encounter: Payer: Self-pay | Admitting: Gastroenterology

## 2015-01-26 ENCOUNTER — Other Ambulatory Visit: Payer: Self-pay

## 2015-01-26 ENCOUNTER — Other Ambulatory Visit (HOSPITAL_COMMUNITY): Payer: Self-pay | Admitting: Radiology

## 2015-01-26 DIAGNOSIS — Z1231 Encounter for screening mammogram for malignant neoplasm of breast: Secondary | ICD-10-CM

## 2015-02-02 ENCOUNTER — Other Ambulatory Visit: Payer: Self-pay | Admitting: Cardiology

## 2015-02-03 ENCOUNTER — Other Ambulatory Visit: Payer: Self-pay

## 2015-02-03 MED ORDER — DILTIAZEM HCL ER COATED BEADS 180 MG PO CP24: 180.0000 mg | ORAL_CAPSULE | Freq: Every day | ORAL | Status: DC

## 2015-02-11 ENCOUNTER — Telehealth: Payer: Self-pay | Admitting: Cardiology

## 2015-02-11 NOTE — Telephone Encounter (Signed)
Spoke with pt and clarified that she is to be taking Lisinopril 20 mg BID and HCTZ once a day.  I will call and clarify with [facility] as well.

## 2015-02-11 NOTE — Telephone Encounter (Signed)
Called [facility] and left a message for Melissa to clarify pt's medications.  She will call back if further questions or concerns

## 2015-02-11 NOTE — Telephone Encounter (Signed)
Pt c/o medication issue:  1. Name of Medication: Lisinopril  2. How are you currently taking this medication (dosage and times per day)? 20 mg tabs 2x per day  3. Are you having a reaction (difficulty breathing--STAT)? No  4. What is your medication issue?  Pt calling wanting to verify if she is supposed to be taking this medication 1x per day or 2x per day

## 2015-02-11 NOTE — Telephone Encounter (Signed)
Follow up      Calling to verify bp meds and instructions.  Pt saw her PCP yesterday.

## 2015-03-19 ENCOUNTER — Ambulatory Visit
Admission: RE | Admit: 2015-03-19 | Discharge: 2015-03-19 | Disposition: A | Discharge status: Home / Self Care | Payer: Medicare Other | Source: Ambulatory Visit

## 2015-03-19 DIAGNOSIS — Z1231 Encounter for screening mammogram for malignant neoplasm of breast: Secondary | ICD-10-CM

## 2015-03-31 ENCOUNTER — Other Ambulatory Visit: Payer: Self-pay | Admitting: Cardiology

## 2015-04-17 ENCOUNTER — Encounter: Payer: Self-pay | Admitting: Gastroenterology

## 2015-05-05 ENCOUNTER — Ambulatory Visit: Payer: BLUE CROSS/BLUE SHIELD | Admitting: Nurse Practitioner

## 2015-05-25 ENCOUNTER — Other Ambulatory Visit: Payer: Self-pay | Admitting: Cardiology

## 2015-05-25 ENCOUNTER — Other Ambulatory Visit: Payer: Self-pay | Admitting: Nurse Practitioner

## 2015-05-25 NOTE — Telephone Encounter (Signed)
Has follow-up appt scheduled.

## 2015-05-26 ENCOUNTER — Ambulatory Visit (INDEPENDENT_AMBULATORY_CARE_PROVIDER_SITE_OTHER): Payer: Medicare Other | Admitting: Nurse Practitioner

## 2015-05-26 ENCOUNTER — Encounter: Payer: Self-pay | Admitting: Nurse Practitioner

## 2015-05-26 VITALS — BP 154/110 | HR 60 | Ht 65.0 in | Wt 196.8 lb

## 2015-05-26 DIAGNOSIS — R4189 Other symptoms and signs involving cognitive functions and awareness: Secondary | ICD-10-CM

## 2015-05-26 DIAGNOSIS — R413 Other amnesia: Secondary | ICD-10-CM | POA: Diagnosis not present

## 2015-05-26 MED ORDER — DONEPEZIL HCL 23 MG PO TABS: ORAL_TABLET | ORAL | Status: DC

## 2015-05-26 NOTE — Progress Notes (Signed)
[facility]  PATIENT: Savannah Hinton DOB: [birth year]   REASON FOR VISIT: Follow-up for memory loss HISTORY FROM: Patient and husband    HISTORY OF PRESENT ILLNESS:Savannah Hinton is a 80 years old right-handed Caucasian female, referred by her primary care physician Dr. Marinda Elk, for evaluation of memory loss. She is accompanied by her son, and her husband at today's clinical visit.  She had past medical history of hypertension, bilateral cataract surgery, right hand surgery, had 12 years of education, is a retired [employer] operational officer.  Since 2013, she was noticed to have mild word finding difficulties, memory trouble, difficulty working with members, difficulty to balance her checkbook, she denied visual loss, she has good appetite, sleeping well, she has occasionally diarrhea with greasy food.  Her word finding difficulty has gotten worse over the past 3 months, herself does not agree that she has significant memory trouble  Both of her parents died of stroke at age 78s, her older sister died of Alzheimer's at age 87  UPDATE May 26, 2015. Savannah Hinton, 80 year old female returns for followup.She is  accompanied by her husband. She was last seen 11/05/14. She and her husband feel her memory is much better with the increase in her Aricept dose to 23 mg daily. MRI of the brain demonstrated moderate atrophy, mainly involving bilateral frontal, parietal, temporal region, mild small vessel disease, laboratory showed low normal B12, 220, rest of the laboratory was normal including RPR, TSH, ANA, CMP with exception of mild elevated glucose 130, She is taking B12 supplement by mouth. She does not exercise. Appetite is reportedly good she is sleeping well. She denies side effects to Aricept . She continues to drive short distances with out getting lost. No longer cooks. Independent in dressing bathing and eating. She returns for reevaluation   REVIEW OF SYSTEMS: Full 14 system review  of systems performed and notable only for those listed, all others are neg:  Constitutional: neg  Cardiovascular: neg Ear/Nose/Throat: neg  Skin: neg Eyes: neg Respiratory: neg Gastroitestinal: Urinary frequency  Hematology/Lymphatic: neg  Endocrine: neg Musculoskeletal: Walking difficulty Allergy/Immunology: neg Neurological: Memory loss Psychiatric: neg Sleep : neg   ALLERGIES: Allergies  Allergen Reactions  . Codeine     hallucinations  . Penicillins Other (See Comments)    Yeast infection     HOME MEDICATIONS: Outpatient Prescriptions Prior to Visit  Medication Sig Dispense Refill  . Acetaminophen (TYLENOL PO) Take 500 mg by mouth. Prn    . apixaban (ELIQUIS) 5 MG TABS tablet Take 5 mg by mouth 2 (two) times daily.    . Cholecalciferol (VITAMIN D3) 3000 UNITS TABS Take 3,000 Units by mouth daily.    . Cyanocobalamin (VITAMIN B 12 PO) Take by mouth daily.    Marland Kitchen diltiazem (CARDIZEM CD) 180 MG 24 hr capsule Take 1 capsule (180 mg total) by mouth daily. 30 capsule 6  . donepezil (ARICEPT) 23 MG TABS tablet TAKE 1 TABLET (23 MG TOTAL) BY MOUTH DAILY. 30 tablet 0  . hydrochlorothiazide (HYDRODIURIL) 25 MG tablet TAKE 1 TABLET (25 MG TOTAL) BY MOUTH DAILY. 30 tablet 3  . lisinopril (PRINIVIL,ZESTRIL) 20 MG tablet TAKE 1 TABLET (20 MG TOTAL) BY MOUTH 2 (TWO) TIMES DAILY. 60 tablet 6  . NON FORMULARY every 14 (fourteen) days. allergy shots    . timolol (TIMOPTIC) 0.5 % ophthalmic solution as needed.      No facility-administered medications prior to visit.    PAST MEDICAL HISTORY: Past Medical History  Diagnosis Date  .  Hypertension   . Allergy   . Hyperlipidemia   . Glaucoma   . Memory loss   . Atrial fibrillation     PAST SURGICAL HISTORY: Past Surgical History  Procedure Laterality Date  . Retinal detachment surgery    . Appendectomy  1974  . Cholecystectomy  2012    FAMILY HISTORY: Family History  Problem Relation Age of Onset  . Cancer Sister      breast  . Stroke Mother   . Stroke Father   . Heart attack Paternal Uncle     SOCIAL HISTORY: Social History   Social History  . Marital Status: Married    Spouse Name: Savannah Hinton  . Number of Children: 1  . Years of Education: N/A   Occupational History  .      retired   Social History Main Topics  . Smoking status: Never Smoker   . Smokeless tobacco: Never Used  . Alcohol Use: No  . Drug Use: No  . Sexual Activity: Not on file   Other Topics Concern  . Not on file   Social History Narrative   Patient lives at home with her husband Savannah Hinton ) and her son was with her also Savannah Hua.   Retired.   Caffeine- half cup of coffee.   Right handed.           PHYSICAL EXAM  Filed Vitals:   05/26/15 1103  Pulse: 60  Height: 5\' 5"  (1.651 m)  Weight: 196 lb 12.8 oz (89.268 kg)   Body mass index is 32.75 kg/(m^2). Generalized: Well developed,obese female in no acute distress  Head: normocephalic and atraumatic,. Oropharynx benign  Neck: Supple, no carotid bruits  Cardiac: Regular rate rhythm, no murmur  Musculoskeletal: No deformity   Neurological examination   Mentation: Alert oriented to time, place, history taking. MMSE 27/30.AFT 5. Clock drawing 2/4.  Follows all commands speech and language fluent.   Cranial nerve II-XII: Fundoscopic exam reveals sharp disc margins.Pupils were equal round reactive to light extraocular movements were full, visual field were full on confrontational test. Facial sensation and strength were normal. hearing was intact to finger rubbing bilaterally. Uvula tongue midline. head turning and shoulder shrug were normal and symmetric.Tongue protrusion into cheek strength was normal. Motor: normal bulk and tone, full strength in the BUE, BLE, fine finger movements normal, no pronator drift. No focal weakness Sensory: normal and symmetric to light touch, pinprick, and Vibration, proprioception  Coordination: finger-nose-finger, heel-to-shin  bilaterally, no dysmetria Reflexes: Brachioradialis 2/2, biceps 2/2, triceps 2/2, patellar 2/2, Achilles 2/2, plantar responses were flexor bilaterally. Gait and Station: Rising up from seated position without assistance, normal stance, moderate stride, good arm swing, smooth turning, able to perform tiptoe, and heel walking without difficulty. Tandem gait is mildly unsteady. No assistive device.  DIAGNOSTIC DATA (LABS, IMAGING, TESTING) -  ASSESSMENT AND PLAN 80 y.o. year old female  has a past medical history of Hypertension; Allergy; Hyperlipidemia; Glaucoma; Memory loss; and Atrial fibrillation. Here to follow up. Memory score 27 out of 30.  Currently on Aricept at 23mg  daily. The patient is a current patient of Dr. Terrace Arabia  who is out of the office today . This note is sent to the work in doctor.      Continue Aricept  23 mg daily, will refill Given list of activities to stimulate the mind such as crosswords, strategy games, etc. discussed the importance of exercising the mind with above.  Exercise by walking for overall health and well  being  F/U in 6 months Vst time 25 min Nilda Riggs, [facility], [facility], APRN  [facility] [address] [place], [place] [address] [phone]

## 2015-05-26 NOTE — Patient Instructions (Signed)
Aricept to 23 mg daily, will refill Given list of activities to stimulate the mind such as crosswords, strategy games, etc.  Exercise by walking for overall health and well being  F/U in 6 months

## 2015-05-26 NOTE — Progress Notes (Signed)
I reviewed note and agree with plan.   Holston Oyama R. Bryan Goin, MD 05/26/2015, 4:33 PM Certified in Neurology, Neurophysiology and Neuroimaging  [facility] [address], [address] [place], [place] [address] [phone]  

## 2015-06-30 ENCOUNTER — Other Ambulatory Visit: Payer: Self-pay | Admitting: Nurse Practitioner

## 2015-07-27 ENCOUNTER — Other Ambulatory Visit: Payer: Self-pay | Admitting: Cardiology

## 2015-08-27 ENCOUNTER — Other Ambulatory Visit: Payer: Self-pay

## 2015-08-27 ENCOUNTER — Other Ambulatory Visit: Payer: Self-pay | Admitting: Cardiology

## 2015-08-27 MED ORDER — HYDROCHLOROTHIAZIDE 25 MG PO TABS: ORAL_TABLET | ORAL | Status: DC

## 2015-11-02 DIAGNOSIS — J3089 Other allergic rhinitis: Secondary | ICD-10-CM | POA: Diagnosis not present

## 2015-11-02 DIAGNOSIS — J301 Allergic rhinitis due to pollen: Secondary | ICD-10-CM | POA: Diagnosis not present

## 2015-11-17 DIAGNOSIS — J301 Allergic rhinitis due to pollen: Secondary | ICD-10-CM | POA: Diagnosis not present

## 2015-11-17 DIAGNOSIS — J3089 Other allergic rhinitis: Secondary | ICD-10-CM | POA: Diagnosis not present

## 2015-11-24 ENCOUNTER — Encounter: Payer: Self-pay | Admitting: Nurse Practitioner

## 2015-11-24 ENCOUNTER — Ambulatory Visit (INDEPENDENT_AMBULATORY_CARE_PROVIDER_SITE_OTHER): Payer: Medicare Other | Admitting: Nurse Practitioner

## 2015-11-24 VITALS — BP 138/67 | HR 76 | Ht 65.0 in | Wt 207.4 lb

## 2015-11-24 DIAGNOSIS — R413 Other amnesia: Secondary | ICD-10-CM

## 2015-11-24 DIAGNOSIS — I10 Essential (primary) hypertension: Secondary | ICD-10-CM | POA: Diagnosis not present

## 2015-11-24 DIAGNOSIS — R4189 Other symptoms and signs involving cognitive functions and awareness: Secondary | ICD-10-CM | POA: Diagnosis not present

## 2015-11-24 MED ORDER — DONEPEZIL HCL 23 MG PO TABS: ORAL_TABLET | ORAL | Status: DC

## 2015-11-24 NOTE — Progress Notes (Signed)
I have reviewed and agreed above plan. 

## 2015-11-24 NOTE — Patient Instructions (Signed)
Continue Aricept 23 mg daily taken in the morning refilled Exercise memory of doing crosswords strategy games etc. Follow-up in 6 months next visit with Dr. Terrace Arabia

## 2015-11-24 NOTE — Progress Notes (Signed)
[facility]  PATIENT: CECILLIA Hinton DOB: [birth year]   REASON FOR VISIT: Follow-up for memory loss HISTORY FROM: Patient and husband    HISTORY OF PRESENT ILLNESS:Savannah Hinton is a 81 years old right-handed Caucasian female, referred by her primary care physician Dr. Marinda Elk, for evaluation of memory loss. She is accompanied by her son, and her husband at today's clinical visit.  She had past medical history of hypertension, bilateral cataract surgery, right hand surgery, had 12 years of education, is a retired [employer] operational officer.  Since 2013, she was noticed to have mild word finding difficulties, memory trouble, difficulty working with members, difficulty to balance her checkbook, she denied visual loss, she has good appetite, sleeping well, she has occasionally diarrhea with greasy food.  Her word finding difficulty has gotten worse over the past 3 months, herself does not agree that she has significant memory trouble  Both of her parents died of stroke at age 76s, her older sister died of Alzheimer's at age 12  UPDATE May 26, 2015. Savannah Hinton, 81 year old female returns for followup.She is accompanied by her husband. She was last seen 11/05/14. She and her husband feel her memory is much better with the increase in her Aricept dose to 23 mg daily. MRI of the brain demonstrated moderate atrophy, mainly involving bilateral frontal, parietal, temporal region, mild small vessel disease, laboratory showed low normal B12, 220, rest of the laboratory was normal including RPR, TSH, ANA, CMP with exception of mild elevated glucose 130, She is taking B12 supplement by mouth. She does not exercise. Appetite is reportedly good she is sleeping well. She denies side effects to Aricept . She continues to drive short distances with out getting lost. No longer cooks. Independent in dressing bathing and eating. She returns for reevaluation  UPDATE 11/24/2015 Savannah Hinton, 80 year old  female returns for follow-up. She has a history of memory loss which is stable according to her husband and herself. She can still perform activities of daily living to include dressing bathing and feeding. She does not drive. She does not exercise. Appetite is reportedly good and she sleeps well at night she does have vivid dreaming on her Aricept and was told to take that in the morning. She returns for reevaluation  REVIEW OF SYSTEMS: Full 14 system review of systems performed and notable only for those listed, all others are neg:  Constitutional: neg  Cardiovascular: neg Ear/Nose/Throat: neg  Skin: neg Eyes: neg Respiratory: neg Gastroitestinal: neg  Hematology/Lymphatic: neg  Endocrine: neg Musculoskeletal:neg Allergy/Immunology: neg Neurological: Memory loss Psychiatric: neg Sleep : neg   ALLERGIES: Allergies  Allergen Reactions  . Codeine     hallucinations  . Penicillins Other (See Comments)    Yeast infection     HOME MEDICATIONS: Outpatient Prescriptions Prior to Visit  Medication Sig Dispense Refill  . Acetaminophen (TYLENOL PO) Take 500 mg by mouth. Prn    . apixaban (ELIQUIS) 5 MG TABS tablet Take 5 mg by mouth 2 (two) times daily.    . Cholecalciferol (VITAMIN D3) 3000 UNITS TABS Take 5,000 Units by mouth daily.     Marland Kitchen diltiazem (CARDIZEM CD) 180 MG 24 hr capsule TAKE 1 CAPSULE (180 MG TOTAL) BY MOUTH DAILY. 30 capsule 6  . donepezil (ARICEPT) 23 MG TABS tablet TAKE 1 TABLET (23 MG TOTAL) BY MOUTH DAILY. 30 tablet 6  . hydrochlorothiazide (HYDRODIURIL) 25 MG tablet TAKE 1 TABLET (25 MG TOTAL) BY MOUTH DAILY. 30 tablet 11  . lisinopril (PRINIVIL,ZESTRIL) 20 MG  tablet TAKE 1 TABLET (20 MG TOTAL) BY MOUTH 2 (TWO) TIMES DAILY. 60 tablet 6  . NON FORMULARY every 14 (fourteen) days. allergy shots    . timolol (TIMOPTIC) 0.5 % ophthalmic solution as needed.     . Cyanocobalamin (VITAMIN B 12 PO) Take by mouth daily.    Marland Kitchen donepezil (ARICEPT) 23 MG TABS tablet TAKE 1  TABLET (23 MG TOTAL) BY MOUTH DAILY. 30 tablet 6   No facility-administered medications prior to visit.    PAST MEDICAL HISTORY: Past Medical History  Diagnosis Date  . Hypertension   . Allergy   . Hyperlipidemia   . Glaucoma   . Memory loss   . Atrial fibrillation (HCC)     PAST SURGICAL HISTORY: Past Surgical History  Procedure Laterality Date  . Retinal detachment surgery    . Appendectomy  1974  . Cholecystectomy  2012    FAMILY HISTORY: Family History  Problem Relation Age of Onset  . Cancer Sister     breast  . Stroke Mother   . Stroke Father   . Heart attack Paternal Uncle     SOCIAL HISTORY: Social History   Social History  . Marital Status: Married    Spouse Name: Johnnie  . Number of Children: 1  . Years of Education: N/A   Occupational History  .      retired   Social History Main Topics  . Smoking status: Never Smoker   . Smokeless tobacco: Never Used  . Alcohol Use: No  . Drug Use: No  . Sexual Activity: Not on file   Other Topics Concern  . Not on file   Social History Narrative   Patient lives at home with her husband [name] ) and her son was with her also Onalee Hua.   Retired.   Caffeine- half cup of coffee.   Right handed.           PHYSICAL EXAM  Filed Vitals:   11/24/15 1344  BP: 138/67  Pulse: 76  Height:  (1.651 m)  Weight: 207 lb 6.4 oz (94.076 kg)   Body mass index is 34.51 kg/(m^2). Generalized: Well developed,obese female in no acute distress  Head: normocephalic and atraumatic,. Oropharynx benign  Neck: Supple, no carotid bruits  Cardiac: Regular rate rhythm, no murmur  Musculoskeletal: No deformity   Neurological examination   Mentation: Alert oriented to time, place, history taking. MMSE 25/30.AFT 2. Clock drawing 2/4.  Follows all commands speech and language fluent.   Cranial nerve II-XII: Fundoscopic exam reveals sharp disc margins.Pupils were equal round reactive to light extraocular  movements were full, visual field were full on confrontational test. Facial sensation and strength were normal. hearing was intact to finger rubbing bilaterally. Uvula tongue midline. head turning and shoulder shrug were normal and symmetric.Tongue protrusion into cheek strength was normal. Motor: normal bulk and tone, full strength in the BUE, BLE, fine finger movements normal, no pronator drift. No focal weakness Sensory: normal and symmetric to light touch, pinprick, and Vibration, proprioception  Coordination: finger-nose-finger, heel-to-shin bilaterally, no dysmetria Reflexes: Brachioradialis 2/2, biceps 2/2, triceps 2/2, patellar 2/2, Achilles 2/2, plantar responses were flexor bilaterally. Gait and Station: Rising up from seated position without assistance, normal stance, moderate stride, good arm swing, smooth turning, able to perform tiptoe, and heel walking without difficulty. Tandem gait is mildly unsteady. No assistive device. DIAGNOSTIC DATA (LABS, IMAGING, TESTING) - ASSESSMENT AND PLAN 81 y.o. year old female has a past medical history of Hypertension;  Allergy; Hyperlipidemia; Glaucoma; Memory loss; and Atrial fibrillation. Here to follow up. Memory score 25 out of 30. Currently on Aricept at  daily.   Continue Aricept 23 mg daily taken in the morning refilled Create a safe environment, remove locks on bathroom  Doors Reduced confusion, keep familiar objects and people around, stick to a routine Use effective communication such as simple words and short sentences Reduce nighttime restlessness, a consistent nighttime routine,  avoid napping during the day Encourage good nutrition and hydration Exercise memory of doing crosswords strategy games etc. Follow-up in 6 months next visit with Dr. Carmelina Noun, [facility], [facility], APRN  [facility] [address], [address] [place], [place] [address] [phone]

## 2015-11-30 DIAGNOSIS — J3089 Other allergic rhinitis: Secondary | ICD-10-CM | POA: Diagnosis not present

## 2015-11-30 DIAGNOSIS — J301 Allergic rhinitis due to pollen: Secondary | ICD-10-CM | POA: Diagnosis not present

## 2015-12-14 DIAGNOSIS — J301 Allergic rhinitis due to pollen: Secondary | ICD-10-CM | POA: Diagnosis not present

## 2015-12-14 DIAGNOSIS — J3089 Other allergic rhinitis: Secondary | ICD-10-CM | POA: Diagnosis not present

## 2015-12-23 DIAGNOSIS — M25561 Pain in right knee: Secondary | ICD-10-CM | POA: Diagnosis not present

## 2015-12-23 DIAGNOSIS — H1045 Other chronic allergic conjunctivitis: Secondary | ICD-10-CM | POA: Diagnosis not present

## 2015-12-23 DIAGNOSIS — J301 Allergic rhinitis due to pollen: Secondary | ICD-10-CM | POA: Diagnosis not present

## 2015-12-23 DIAGNOSIS — J3089 Other allergic rhinitis: Secondary | ICD-10-CM | POA: Diagnosis not present

## 2015-12-23 DIAGNOSIS — S8391XA Sprain of unspecified site of right knee, initial encounter: Secondary | ICD-10-CM | POA: Diagnosis not present

## 2016-01-04 DIAGNOSIS — H401131 Primary open-angle glaucoma, bilateral, mild stage: Secondary | ICD-10-CM | POA: Diagnosis not present

## 2016-01-14 DIAGNOSIS — J301 Allergic rhinitis due to pollen: Secondary | ICD-10-CM | POA: Diagnosis not present

## 2016-01-14 DIAGNOSIS — J3089 Other allergic rhinitis: Secondary | ICD-10-CM | POA: Diagnosis not present

## 2016-01-25 DIAGNOSIS — J3089 Other allergic rhinitis: Secondary | ICD-10-CM | POA: Diagnosis not present

## 2016-01-25 DIAGNOSIS — J301 Allergic rhinitis due to pollen: Secondary | ICD-10-CM | POA: Diagnosis not present

## 2016-02-08 ENCOUNTER — Other Ambulatory Visit: Payer: Self-pay

## 2016-02-08 DIAGNOSIS — J301 Allergic rhinitis due to pollen: Secondary | ICD-10-CM | POA: Diagnosis not present

## 2016-02-08 DIAGNOSIS — Z1231 Encounter for screening mammogram for malignant neoplasm of breast: Secondary | ICD-10-CM

## 2016-02-08 DIAGNOSIS — J3089 Other allergic rhinitis: Secondary | ICD-10-CM | POA: Diagnosis not present

## 2016-02-11 DIAGNOSIS — L720 Epidermal cyst: Secondary | ICD-10-CM | POA: Diagnosis not present

## 2016-02-11 DIAGNOSIS — L821 Other seborrheic keratosis: Secondary | ICD-10-CM | POA: Diagnosis not present

## 2016-02-23 DIAGNOSIS — J3089 Other allergic rhinitis: Secondary | ICD-10-CM | POA: Diagnosis not present

## 2016-02-23 DIAGNOSIS — J301 Allergic rhinitis due to pollen: Secondary | ICD-10-CM | POA: Diagnosis not present

## 2016-03-03 DIAGNOSIS — J3089 Other allergic rhinitis: Secondary | ICD-10-CM | POA: Diagnosis not present

## 2016-03-03 DIAGNOSIS — J301 Allergic rhinitis due to pollen: Secondary | ICD-10-CM | POA: Diagnosis not present

## 2016-03-07 DIAGNOSIS — J3089 Other allergic rhinitis: Secondary | ICD-10-CM | POA: Diagnosis not present

## 2016-03-07 DIAGNOSIS — J301 Allergic rhinitis due to pollen: Secondary | ICD-10-CM | POA: Diagnosis not present

## 2016-03-16 ENCOUNTER — Other Ambulatory Visit: Payer: Self-pay | Admitting: Cardiology

## 2016-03-16 ENCOUNTER — Other Ambulatory Visit: Payer: Self-pay | Admitting: *Deleted

## 2016-03-16 MED ORDER — LISINOPRIL 20 MG PO TABS: ORAL_TABLET | ORAL | Status: DC

## 2016-03-16 MED ORDER — DILTIAZEM HCL ER COATED BEADS 180 MG PO CP24: ORAL_CAPSULE | ORAL | Status: DC

## 2016-03-21 ENCOUNTER — Ambulatory Visit
Admission: RE | Admit: 2016-03-21 | Discharge: 2016-03-21 | Disposition: A | Discharge status: Home / Self Care | Payer: Medicare Other | Source: Ambulatory Visit

## 2016-03-21 ENCOUNTER — Other Ambulatory Visit: Payer: Self-pay | Admitting: Obstetrics & Gynecology

## 2016-03-21 DIAGNOSIS — Z1231 Encounter for screening mammogram for malignant neoplasm of breast: Secondary | ICD-10-CM

## 2016-03-30 DIAGNOSIS — J3089 Other allergic rhinitis: Secondary | ICD-10-CM | POA: Diagnosis not present

## 2016-03-30 DIAGNOSIS — J301 Allergic rhinitis due to pollen: Secondary | ICD-10-CM | POA: Diagnosis not present

## 2016-04-11 ENCOUNTER — Emergency Department (HOSPITAL_BASED_OUTPATIENT_CLINIC_OR_DEPARTMENT_OTHER)
Admission: EM | Admit: 2016-04-11 | Discharge: 2016-04-11 | Disposition: A | Discharge status: Other Acute Inpatient Hospital | Payer: Medicare Other | Attending: Emergency Medicine | Admitting: Emergency Medicine

## 2016-04-11 ENCOUNTER — Emergency Department (HOSPITAL_COMMUNITY): Payer: Medicare Other

## 2016-04-11 ENCOUNTER — Encounter (HOSPITAL_BASED_OUTPATIENT_CLINIC_OR_DEPARTMENT_OTHER): Payer: Self-pay | Admitting: Emergency Medicine

## 2016-04-11 ENCOUNTER — Emergency Department (HOSPITAL_BASED_OUTPATIENT_CLINIC_OR_DEPARTMENT_OTHER): Payer: Medicare Other

## 2016-04-11 DIAGNOSIS — Z79899 Other long term (current) drug therapy: Secondary | ICD-10-CM | POA: Diagnosis not present

## 2016-04-11 DIAGNOSIS — I4891 Unspecified atrial fibrillation: Secondary | ICD-10-CM | POA: Diagnosis not present

## 2016-04-11 DIAGNOSIS — Z7901 Long term (current) use of anticoagulants: Secondary | ICD-10-CM | POA: Insufficient documentation

## 2016-04-11 DIAGNOSIS — I10 Essential (primary) hypertension: Secondary | ICD-10-CM | POA: Diagnosis not present

## 2016-04-11 DIAGNOSIS — E785 Hyperlipidemia, unspecified: Secondary | ICD-10-CM | POA: Diagnosis not present

## 2016-04-11 DIAGNOSIS — R404 Transient alteration of awareness: Secondary | ICD-10-CM | POA: Diagnosis not present

## 2016-04-11 DIAGNOSIS — Z5181 Encounter for therapeutic drug level monitoring: Secondary | ICD-10-CM | POA: Diagnosis not present

## 2016-04-11 DIAGNOSIS — R42 Dizziness and giddiness: Secondary | ICD-10-CM | POA: Diagnosis not present

## 2016-04-11 HISTORY — DX: Unspecified dementia, unspecified severity, without behavioral disturbance, psychotic disturbance, mood disturbance, and anxiety: F03.90

## 2016-04-11 HISTORY — DX: Dizziness and giddiness: R42

## 2016-04-11 LAB — COMPREHENSIVE METABOLIC PANEL
ALK PHOS: 61 U/L (ref 38–126)
ALT: 17 U/L (ref 14–54)
AST: 22 U/L (ref 15–41)
Albumin: 4.1 g/dL (ref 3.5–5.0)
Anion gap: 8 (ref 5–15)
BUN: 23 mg/dL — ABNORMAL HIGH (ref 6–20)
CALCIUM: 9.1 mg/dL (ref 8.9–10.3)
CO2: 27 mmol/L (ref 22–32)
Chloride: 105 mmol/L (ref 101–111)
Creatinine, Ser: 0.93 mg/dL (ref 0.44–1.00)
GFR calc non Af Amer: 57 mL/min — ABNORMAL LOW (ref >60)
Glucose, Bld: 109 mg/dL — ABNORMAL HIGH (ref 65–99)
Potassium: 3.7 mmol/L (ref 3.5–5.1)
Sodium: 140 mmol/L (ref 135–145)
TOTAL PROTEIN: 7.3 g/dL (ref 6.5–8.1)
Total Bilirubin: 0.6 mg/dL (ref 0.3–1.2)

## 2016-04-11 LAB — CBC WITH DIFFERENTIAL/PLATELET
Basophils Absolute: 0 10*3/uL (ref 0.0–0.1)
Basophils Relative: 1 %
EOS ABS: 0.1 10*3/uL (ref 0.0–0.7)
Eosinophils Relative: 1 %
HCT: 45 % (ref 36.0–46.0)
HEMOGLOBIN: 14.7 g/dL (ref 12.0–15.0)
LYMPHS ABS: 1.5 10*3/uL (ref 0.7–4.0)
Lymphocytes Relative: 20 %
MCH: 29 pg (ref 26.0–34.0)
MCHC: 32.7 g/dL (ref 30.0–36.0)
MCV: 88.8 fL (ref 78.0–100.0)
MONO ABS: 0.6 10*3/uL (ref 0.1–1.0)
MONOS PCT: 8 %
NEUTROS PCT: 72 %
Neutro Abs: 5.4 10*3/uL (ref 1.7–7.7)
Platelets: 203 10*3/uL (ref 150–400)
RBC: 5.07 MIL/uL (ref 3.87–5.11)
RDW: 14.6 % (ref 11.5–15.5)
WBC: 7.5 10*3/uL (ref 4.0–10.5)

## 2016-04-11 LAB — URINALYSIS, ROUTINE W REFLEX MICROSCOPIC
Bilirubin Urine: NEGATIVE
GLUCOSE, UA: NEGATIVE mg/dL
Ketones, ur: NEGATIVE mg/dL
Nitrite: NEGATIVE
Protein, ur: NEGATIVE mg/dL
Specific Gravity, Urine: 1.011 (ref 1.005–1.030)
pH: 6.5 (ref 5.0–8.0)

## 2016-04-11 LAB — URINE MICROSCOPIC-ADD ON

## 2016-04-11 LAB — PROTIME-INR
INR: 1.11 (ref 0.00–1.49)
Prothrombin Time: 14.5 seconds (ref 11.6–15.2)

## 2016-04-11 LAB — TROPONIN I

## 2016-04-11 MED ORDER — MECLIZINE HCL 25 MG PO TABS: 25.0000 mg | ORAL_TABLET | Freq: Three times a day (TID) | ORAL | Status: DC | PRN

## 2016-04-11 NOTE — ED Notes (Signed)
MD at bedside. 

## 2016-04-11 NOTE — ED Notes (Signed)
Pt transported to [facility] 

## 2016-04-11 NOTE — ED Notes (Signed)
Pt arrives [facility]. Seen with dizziness and here for MRI.

## 2016-04-11 NOTE — ED Notes (Signed)
Pt reports waking up this morning at 5:00 with the room spinning. Pt denies chest pain, SOB, N/V. Pt reports the room is still spinning.

## 2016-04-11 NOTE — Discharge Instructions (Signed)

## 2016-04-11 NOTE — ED Provider Notes (Signed)
Patient arrives for MRI. She is alert and in no distress. Reviewed history of present illness is very suggestive of vertigo. And when has consult and Dr. Amada JupiterKirkpatrick and recommendation is for MRI and negative patient is okay for discharge. Patient has remained stable with clear mental status and stable vital signs. At this point I will prescribe Antivert for symptoms consistent with benign positional vertigo.  Arby BarretteMarcy Roizy Harold, MD 04/11/16 [phone]

## 2016-04-11 NOTE — ED Provider Notes (Signed)
CSN: [id number]     Arrival date & time 04/11/16  0913 History   First MD Initiated Contact with Patient 04/11/16 [phone]     Chief Complaint  Patient presents with  . Dizziness     (Consider location/radiation/quality/duration/timing/severity/associated sxs/prior Treatment) HPI Comments: 81 year old female with history of hypertension, dementia, atrial fibrillation on Eliquis presents for dizziness. The patient and her husband report that she has had dizziness since she woke up this morning at 5 AM. She was off balance trying to walk to the bathroom and her husband had to assist her which is abnormal. She has continued to appear slightly off balance and has continued to complain of dizziness like the room is spinning. She has never had symptoms like this before. She denies headache. No chest pain, shortness of breath, nausea, vomiting. Of note the patient's son was recently in a bad car accident and was just released from the hospital after being inpatient for 5 weeks.  Patient is a 81 y.o. female presenting with dizziness.  Dizziness Associated symptoms: no chest pain, no diarrhea, no nausea, no palpitations, no shortness of breath, no vomiting and no weakness     Past Medical History  Diagnosis Date  . Hypertension   . Allergy   . Hyperlipidemia   . Glaucoma   . Memory loss   . Atrial fibrillation (HCC)   . Vertigo   . Dementia    Past Surgical History  Procedure Laterality Date  . Retinal detachment surgery    . Appendectomy  1974  . Cholecystectomy  2012   Family History  Problem Relation Age of Onset  . Cancer Sister     breast  . Stroke Mother   . Stroke Father   . Heart attack Paternal Uncle    Social History  Substance Use Topics  . Smoking status: Never Smoker   . Smokeless tobacco: Never Used  . Alcohol Use: No   OB History    No data available     Review of Systems  Constitutional: Negative for fever, chills, diaphoresis and fatigue.  HENT: Negative for  congestion, postnasal drip, rhinorrhea and sinus pressure.   Eyes: Negative for visual disturbance.  Respiratory: Negative for cough, chest tightness, shortness of breath and wheezing.   Cardiovascular: Negative for chest pain, palpitations and leg swelling.  Gastrointestinal: Negative for nausea, vomiting, abdominal pain and diarrhea.  Genitourinary: Negative for dysuria, urgency and hematuria.  Musculoskeletal: Negative for myalgias and back pain.  Skin: Negative for rash.  Neurological: Positive for dizziness. Negative for syncope, weakness, light-headedness and numbness.  Hematological: Bruises/bleeds easily.      Allergies  Codeine and Penicillins  Home Medications   Prior to Admission medications   Medication Sig Start Date End Date Taking? Authorizing Provider  Acetaminophen (TYLENOL PO) Take 500 mg by mouth. Prn    Historical Provider, MD  apixaban (ELIQUIS) 5 MG TABS tablet Take 5 mg by mouth 2 (two) times daily.    Historical Provider, MD  Cholecalciferol (VITAMIN D3) 3000 UNITS TABS Take 5,000 Units by mouth daily.     Historical Provider, MD  Cyanocobalamin (VITAMIN B 12 PO) Take by mouth daily.    Historical Provider, MD  diltiazem (CARDIZEM CD) 180 MG 24 hr capsule TAKE 1 CAPSULE (180 MG TOTAL) BY MOUTH DAILY.**Pt is overdue for an appointment. Please call and schedule for further refills** 03/16/16   Jake BatheMark C Skains, MD  donepezil (ARICEPT) 23 MG TABS tablet TAKE 1 TABLET (23 MG TOTAL) BY  MOUTH DAILY. 11/24/15   Nilda Riggs, NP  hydrochlorothiazide (HYDRODIURIL) 25 MG tablet TAKE 1 TABLET (25 MG TOTAL) BY MOUTH DAILY. 08/27/15   Jake Bathe, MD  lisinopril (PRINIVIL,ZESTRIL) 20 MG tablet TAKE 1 TABLET (20 MG TOTAL) BY MOUTH 2 (TWO) TIMES DAILY.**Pt is overdue for an appointment. Please call and schedule for further refills 03/16/16   Jake Bathe, MD  Multiple Vitamin (MULTIVITAMIN) tablet Take 1 tablet by mouth daily.    Historical Provider, MD  NON FORMULARY every  14 (fourteen) days. allergy shots    Historical Provider, MD  timolol (TIMOPTIC) 0.5 % ophthalmic solution as needed.  06/02/11   Historical Provider, MD   BP 153/87 mmHg  Pulse 90  Temp(Src) 98.2 F (36.8 C) (Oral)  Resp 14  Ht 5\' 4"  (1.626 m)  Wt 207 lb (93.895 kg)  BMI 35.51 kg/m2  SpO2 98% Physical Exam  Constitutional: She is oriented to person, place, and time. She appears well-developed and well-nourished. No distress.  HENT:  Head: Normocephalic and atraumatic.  Right Ear: Tympanic membrane and external ear normal.  Left Ear: Tympanic membrane and external ear normal.  Nose: Nose normal.  Mouth/Throat: Oropharynx is clear and moist. No oropharyngeal exudate.  Eyes: EOM are normal. Pupils are equal, round, and reactive to light. Right eye exhibits no nystagmus. Left eye exhibits no nystagmus.  Neck: Normal range of motion. Neck supple.  Cardiovascular: Normal rate, regular rhythm, normal heart sounds and intact distal pulses.   No murmur heard. Pulmonary/Chest: Effort normal. No respiratory distress. She has no wheezes. She has no rales.  Abdominal: Soft. She exhibits no distension. There is no tenderness.  Musculoskeletal: Normal range of motion. She exhibits no edema or tenderness.  Neurological: She is alert and oriented to person, place, and time. She has normal strength. No cranial nerve deficit or sensory deficit. Coordination normal.  Patient able to complete finger to nose exam bilaterally with some shaking but no apparent difficulty or ataxia.  Able to complete bilateral heel to shin although some difficulty with following direction.  NIHSS 0.  Skin: Skin is warm and dry. No rash noted. She is not diaphoretic.  Vitals reviewed.   ED Course  Procedures (including critical care time) Labs Review Labs Reviewed  URINALYSIS, ROUTINE W REFLEX MICROSCOPIC (NOT AT [facility]) - Abnormal; Notable for the following:    Hgb urine dipstick TRACE (*)    Leukocytes, UA SMALL (*)     All other components within normal limits  COMPREHENSIVE METABOLIC PANEL - Abnormal; Notable for the following:    Glucose, Bld 109 (*)    BUN 23 (*)    GFR calc non Af Amer 57 (*)    All other components within normal limits  URINE MICROSCOPIC-ADD ON - Abnormal; Notable for the following:    Squamous Epithelial / LPF 0-5 (*)    Bacteria, UA RARE (*)    All other components within normal limits  CBC WITH DIFFERENTIAL/PLATELET  TROPONIN I  PROTIME-INR    Imaging Review Ct Head Wo Contrast  04/11/2016  CLINICAL DATA:  81 year old female complaining of dizziness since waking up this morning. Dementia. EXAM: CT HEAD WITHOUT CONTRAST TECHNIQUE: Contiguous axial images were obtained from the base of the skull through the vertex without intravenous contrast. COMPARISON:  MRI of the brain 07/26/2013. FINDINGS: Mild cerebral atrophy. Patchy and confluent areas of decreased attenuation are noted throughout the deep and periventricular white matter of the cerebral hemispheres bilaterally, compatible with chronic  microvascular ischemic disease. Physiologic calcifications in the basal ganglia bilaterally (left greater than right). No acute intracranial abnormalities. Specifically, no evidence of acute intracranial hemorrhage, no definite findings of acute/subacute cerebral ischemia, no mass, mass effect, hydrocephalus or abnormal intra or extra-axial fluid collections. Visualized paranasal sinuses and mastoids are well pneumatized. No acute displaced skull fractures are identified. IMPRESSION: 1. No acute intracranial abnormalities. 2. Mild cerebral atrophy with mild chronic microvascular ischemic changes in the cerebral white matter, as above. Electronically Signed   By: Trudie Reed M.D.   On: 04/11/2016 10:02   I have personally reviewed and evaluated these images and lab results as part of my medical decision-making.   EKG Interpretation   Date/Time:  Monday April 11 2016 09:34:50 EDT Ventricular  Rate:  89 PR Interval:    QRS Duration: 92 QT Interval:  408 QTC Calculation: 497 R Axis:   -53 Text Interpretation:  Atrial fibrillation LAD, consider left anterior  fascicular block Abnormal R-wave progression, early transition Borderline  T abnormalities, anterior leads Borderline prolonged QT interval No  significant change since last tracing Confirmed by Avereigh Spainhower ([id number])  on 04/11/2016 9:42:28 AM      MDM  Patient was seen and evaluated in stable condition. Symptoms started prior to patient waking this morning. Patient reporting dizziness like the room spinning. She is at increased stress secondary to atrial fibrillation for CVA. Laboratory results and head CT unremarkable. EKG unremarkable for patient. Patient remained stable in the emergency department and said that her dizziness while present was not severe and did not wish to receive any treatment for at this time. Discussed case with Dr. Amada Jupiter from neurology who agreed with need for an MRI. Discussed case with Dr. Ethelda Chick from the emergency department over at [facility] who agreed with patient transfer. Updated patient and her husband on all results and plan of care. Patient to be transferred to [facility] for completion of MRI of the brain without contrast. If MRI positive for stroke then neurology should be reconsult it. If MRI negative patient appears stable and should be able to be safely discharged home. Final diagnoses:  Dizziness    1. Dizziness    Leta Baptist, MD 04/11/16 1128

## 2016-04-11 NOTE — ED Notes (Signed)
Pt returns from MRI ° °

## 2016-04-11 NOTE — ED Notes (Signed)
Patient transported to CT 

## 2016-04-11 NOTE — ED Notes (Signed)
Pt home stable with husband . States she understands instructions.

## 2016-04-19 ENCOUNTER — Other Ambulatory Visit: Payer: Self-pay | Admitting: Cardiology

## 2016-04-25 DIAGNOSIS — J3089 Other allergic rhinitis: Secondary | ICD-10-CM | POA: Diagnosis not present

## 2016-04-25 DIAGNOSIS — J301 Allergic rhinitis due to pollen: Secondary | ICD-10-CM | POA: Diagnosis not present

## 2016-04-26 DIAGNOSIS — H8110 Benign paroxysmal vertigo, unspecified ear: Secondary | ICD-10-CM | POA: Diagnosis not present

## 2016-04-26 DIAGNOSIS — L639 Alopecia areata, unspecified: Secondary | ICD-10-CM | POA: Diagnosis not present

## 2016-04-26 DIAGNOSIS — F0390 Unspecified dementia without behavioral disturbance: Secondary | ICD-10-CM | POA: Diagnosis not present

## 2016-05-10 DIAGNOSIS — J301 Allergic rhinitis due to pollen: Secondary | ICD-10-CM | POA: Diagnosis not present

## 2016-05-10 DIAGNOSIS — J3089 Other allergic rhinitis: Secondary | ICD-10-CM | POA: Diagnosis not present

## 2016-05-17 ENCOUNTER — Encounter (INDEPENDENT_AMBULATORY_CARE_PROVIDER_SITE_OTHER): Payer: Self-pay

## 2016-05-17 ENCOUNTER — Encounter: Payer: Self-pay | Admitting: Cardiology

## 2016-05-17 ENCOUNTER — Ambulatory Visit (INDEPENDENT_AMBULATORY_CARE_PROVIDER_SITE_OTHER): Payer: Medicare Other | Admitting: Cardiology

## 2016-05-17 VITALS — BP 142/88 | HR 89 | Wt 212.1 lb

## 2016-05-17 DIAGNOSIS — I482 Chronic atrial fibrillation, unspecified: Secondary | ICD-10-CM

## 2016-05-17 DIAGNOSIS — I10 Essential (primary) hypertension: Secondary | ICD-10-CM

## 2016-05-17 DIAGNOSIS — Z7901 Long term (current) use of anticoagulants: Secondary | ICD-10-CM

## 2016-05-17 MED ORDER — HYDROCHLOROTHIAZIDE 25 MG PO TABS: ORAL_TABLET | ORAL | 6 refills | Status: DC

## 2016-05-17 MED ORDER — LISINOPRIL 20 MG PO TABS: ORAL_TABLET | ORAL | 6 refills | Status: DC

## 2016-05-17 MED ORDER — APIXABAN 5 MG PO TABS: 5.0000 mg | ORAL_TABLET | Freq: Two times a day (BID) | ORAL | 6 refills | Status: DC

## 2016-05-17 MED ORDER — DILTIAZEM HCL ER COATED BEADS 180 MG PO CP24: 180.0000 mg | ORAL_CAPSULE | Freq: Every day | ORAL | 6 refills | Status: DC

## 2016-05-17 NOTE — Progress Notes (Signed)
[address] [place], [place]  [address] Phone: [phone] Fax:  [phone]  Date:  05/17/2016   ID:  Savannah Hinton, DOB 1935-02-27, MRN [id number]  PCP:  Ethel RanaHEPLER,Benjimin Hadden, PA-C   History of Present Illness: Savannah GumVivian M Hinton is a 81 y.o. female here for followup atrial fibrillation. Prior rapid ventricular response discovered 02/20/14. Early-onset dementia. Eliquis 5 twice a day. Statin myalgias. TSH normal. Negative DVT. Prior creatinine 1.1. Urinary issues after going to sleep.  Overall she is doing very well. No complaints. Her grandson just got married. No bleeding, no syncope.  She was in the emergency room on 04/11/16 with dizziness when she woke up at 5 AM, off balance trying to walk room was spinning. Her son was in a car accident, was [place] [address] [place] [facility] [place] scan of head was unremarkable. MRI was performed. Reassuring.  Her main complaint is incontinence, hair loss, ankle swelling. She is not having any significant shortness of breath. She still having some vertigo-like symptoms and is sleeping in the recliner because of this. Her husband is accompanying her. No chest pain. No bleeding. Eliquis expensive, doughnut hole.  Wt Readings from Last 3 Encounters:  05/17/16 212 lb 1.9 oz (96.2 kg)  04/11/16 207 lb (93.9 kg)  11/24/15 207 lb 6.4 oz (94.1 kg)     Past Medical History:  Diagnosis Date  . Allergy   . Atrial fibrillation (HCC)   . Dementia   . Glaucoma   . Hyperlipidemia   . Hypertension   . Memory loss   . Vertigo     Past Surgical History:  Procedure Laterality Date  . APPENDECTOMY  1974  . CHOLECYSTECTOMY  2012  . RETINAL DETACHMENT SURGERY      Current Outpatient Prescriptions  Medication Sig Dispense Refill  . acetaminophen (TYLENOL) 500 MG tablet Take 500 mg by mouth every 6 (six) hours as needed for mild pain.    Marland Kitchen. apixaban (ELIQUIS) 5 MG TABS tablet Take 5 mg by mouth 2 (two)  times daily.    Marland Kitchen. CARTIA XT 180 MG 24 hr capsule TAKE 1 CAPSULE(180 MG) BY MOUTH DAILY 30 capsule 0  . Cyanocobalamin (VITAMIN B 12 PO) Take 1 tablet by mouth daily.     Marland Kitchen. donepezil (ARICEPT) 23 MG TABS tablet TAKE 1 TABLET (23 MG TOTAL) BY MOUTH DAILY. 30 tablet 6  . hydrochlorothiazide (HYDRODIURIL) 25 MG tablet TAKE 1 TABLET (25 MG TOTAL) BY MOUTH DAILY. 30 tablet 11  . lisinopril (PRINIVIL,ZESTRIL) 20 MG tablet TAKE 1 TABLET (20 MG TOTAL) BY MOUTH 2 (TWO) TIMES DAILY.**Pt is overdue for an appointment. Please call and schedule for further refills 60 tablet 6  . meclizine (ANTIVERT) 25 MG tablet Take 1 tablet (25 mg total) by mouth 3 (three) times daily as needed for dizziness. 30 tablet 0  . NON FORMULARY every 14 (fourteen) days. allergy shots    . timolol (TIMOPTIC) 0.5 % ophthalmic solution Place 1 drop into both eyes 2 (two) times daily.      No current facility-administered medications for this visit.     Allergies:    Allergies  Allergen Reactions  . Codeine     hallucinations  . Penicillins Other (See Comments)    Yeast infection     Social History:  The patient  reports that she has never smoked. She has never used smokeless tobacco. She reports that she  does not drink alcohol or use drugs.   Family History  Problem Relation Age of Onset  . Stroke Mother   . Stroke Father   . Cancer Sister     breast  . Heart attack Paternal Uncle     ROS:  Please see the history of present illness.   Denies any syncope, bleeding, orthopnea, PND   All other systems reviewed and negative.   PHYSICAL EXAM: VS:  BP (!) 152/120 (BP Location: Right Arm)   Pulse 89   Wt 212 lb 1.9 oz (96.2 kg)   SpO2 98%   BMI 36.41 kg/m  Well nourished, well developed, in no acute distress  HEENT: normal, [place]/AT, EOMI Neck: no JVD, normal carotid upstroke, no bruit Cardiac:  normal S1, S2; irreg; no murmur  Lungs:  clear to auscultation bilaterally, no wheezing, rhonchi or rales  Abd: soft,  nontender, no hepatomegaly, no bruits  Ext: right 2+ edema, 2+ distal pulses Skin: warm and dry  GU: deferred Neuro: no focal abnormalities noted, AAO x 3, memory impairment  EKG:  None today    Echocardiogram:  - Left ventricle: The cavity size was normal. Systolic function was vigorous. The estimated ejection fraction was in the range of 65% to 70%. Wall motion was normal; there were no regional wall motion abnormalities. - Left atrium: The atrium was moderately dilated. - Right atrium: The atrium was mildly dilated. - Pulmonary arteries: Systolic pressure was mildly increased. PA peak pressure: 40 mm Hg (S).   ASSESSMENT AND PLAN:  1. Chronic atrial fibrillation-diltiazem CD as above. Doing well. Rate controlled. Reassuring echocardiogram. Has been very stable. 2. Chronic anticoagulation- doing very well on Eliquis. Tolerating well. No bleeding.  Lovenia KimMark Hepler has been checking blood work every 6 months. Excellent. 3. Hypertension- essential- elevated today. Usually she states that at home her blood pressure is normal. She often will get escalation of blood pressure at doctor's office. Continue with current medication strategy. 4. Lower extremity edema-ankle edema. One could contemplate utilization of Lasix instead of HCTZ however she has a significant problem with incontinence and this would make this worse. I recommended support hose/stockings. Elevation of legs. Monitor salt and fluid intake. 5. Incontinence - frequent. Wondered about ditropan. 6. Since atrial fibrillation has been very stable, I feel culpable seeing her back on as-needed basis. Lovenia KimMark Hepler can continue to prescribe current medications. Please let me know if I can be of further assistance however.  Signed, Donato SchultzMark Aleese Kamps, MD [facility]  05/17/2016 10:53 AM

## 2016-05-17 NOTE — Patient Instructions (Signed)
Medication Instructions:  The current medical regimen is effective;  continue present plan and medications.  Follow-Up: Follow up as needed with Dr Skains.  Thank you for choosing [facility]!!     

## 2016-05-23 DIAGNOSIS — J3089 Other allergic rhinitis: Secondary | ICD-10-CM | POA: Diagnosis not present

## 2016-05-23 DIAGNOSIS — J301 Allergic rhinitis due to pollen: Secondary | ICD-10-CM | POA: Diagnosis not present

## 2016-05-31 ENCOUNTER — Encounter: Payer: Self-pay | Admitting: Neurology

## 2016-05-31 ENCOUNTER — Ambulatory Visit (INDEPENDENT_AMBULATORY_CARE_PROVIDER_SITE_OTHER): Payer: Medicare Other | Admitting: Neurology

## 2016-05-31 VITALS — BP 138/79 | HR 78 | Ht 64.0 in | Wt 213.0 lb

## 2016-05-31 DIAGNOSIS — R42 Dizziness and giddiness: Secondary | ICD-10-CM

## 2016-05-31 DIAGNOSIS — F0280 Dementia in other diseases classified elsewhere without behavioral disturbance: Secondary | ICD-10-CM

## 2016-05-31 DIAGNOSIS — G300 Alzheimer's disease with early onset: Secondary | ICD-10-CM

## 2016-05-31 MED ORDER — DONEPEZIL HCL 23 MG PO TABS: ORAL_TABLET | ORAL | 4 refills | Status: DC

## 2016-05-31 MED ORDER — MEMANTINE HCL 10 MG PO TABS: 10.0000 mg | ORAL_TABLET | Freq: Two times a day (BID) | ORAL | 11 refills | Status: DC

## 2016-05-31 NOTE — Progress Notes (Signed)
[facility]  PATIENT: Savannah Hinton DOB: 1935-07-07  HISTORY OF PRESENT ILLNESS:Savannah Hinton is a 81 years old right-handed Caucasian female, referred by her primary care physician Dr. Marinda Elk, for evaluation of memory loss. Initial evaluation was February 2016  She had past medical history of hypertension, bilateral cataract surgery, right hand surgery, had 12 years of education, is a retired [employer] operational officer.  Since 2013, she was noticed to have mild word finding difficulties, memory trouble, difficulty working with members, difficulty to balance her checkbook, she denied visual loss, she has good appetite, sleeping well, she has occasionally diarrhea with greasy food.   Both of her parents died of stroke at age 24s, her older sister died of Alzheimer's at age 22   MRI of the brain in 2014, demonstrated moderate atrophy, mainly involving bilateral frontal, parietal, temporal region, mild small vessel disease, laboratory showed low normal B12, 220, rest of the laboratory was normal including RPR, TSH, ANA, CMP with exception of mild elevated glucose 130, She is taking B12 supplement by mouth. She does not exercise. Appetite is reportedly good she is sleeping well. She denies side effects to Aricept .   She continues to drive short distances with out getting lost. No longer cooks. Independent in dressing bathing and eating. She returns for reevaluation  UPDATE Sep 5th 2017: She is with her husband, she went to ED on July 17th 2017, she had sudden onset vertigo, lasted for few minutes.  She has been sleeping on recliner, scared to have recurrent vertigo with sudden positional change such as lying down,  I personally reviewed MRI July 17th 2017, there was no acute abnormality, mild cerebral atrophy and small vessel disease,  Laboratory evaluations, normal INR CMP, with exception of mild elevated glucose 109, normal CBC  She has slow worsening memory loss, no longer cooking,  able to do simple house chore, able to maintain is no hygiene, dressing independently,  REVIEW OF SYSTEMS: Full 14 system review of systems performed and notable only for those listed, all  Others are negative.   ALLERGIES: Allergies  Allergen Reactions  . Codeine     hallucinations  . Penicillins Other (See Comments)    Yeast infection     HOME MEDICATIONS: Outpatient Medications Prior to Visit  Medication Sig Dispense Refill  . acetaminophen (TYLENOL) 500 MG tablet Take 500 mg by mouth every 6 (six) hours as needed for mild pain.    Marland Kitchen apixaban (ELIQUIS) 5 MG TABS tablet Take 1 tablet (5 mg total) by mouth 2 (two) times daily. 60 tablet 6  . Cyanocobalamin (VITAMIN B 12 PO) Take 1 tablet by mouth daily.     Marland Kitchen diltiazem (CARTIA XT) 180 MG 24 hr capsule Take 1 capsule (180 mg total) by mouth daily. 30 capsule 6  . donepezil (ARICEPT) 23 MG TABS tablet TAKE 1 TABLET (23 MG TOTAL) BY MOUTH DAILY. 30 tablet 6  . hydrochlorothiazide (HYDRODIURIL) 25 MG tablet TAKE 1 TABLET (25 MG TOTAL) BY MOUTH DAILY. 30 tablet 6  . lisinopril (PRINIVIL,ZESTRIL) 20 MG tablet TAKE 1 TABLET (20 MG TOTAL) BY MOUTH 2 (TWO) TIMES DAILY 60 tablet 6  . meclizine (ANTIVERT) 25 MG tablet Take 1 tablet (25 mg total) by mouth 3 (three) times daily as needed for dizziness. 30 tablet 0  . NON FORMULARY every 14 (fourteen) days. allergy shots    . timolol (TIMOPTIC) 0.5 % ophthalmic solution Place 1 drop into both eyes 2 (two) times daily.  No facility-administered medications prior to visit.     PAST MEDICAL HISTORY: Past Medical History:  Diagnosis Date  . Allergy   . Atrial fibrillation (HCC)   . Dementia   . Glaucoma   . Hyperlipidemia   . Hypertension   . Memory loss   . Vertigo     PAST SURGICAL HISTORY: Past Surgical History:  Procedure Laterality Date  . APPENDECTOMY  1974  . CHOLECYSTECTOMY  2012  . RETINAL DETACHMENT SURGERY      FAMILY HISTORY: Family History  Problem Relation Age  of Onset  . Stroke Mother   . Stroke Father   . Cancer Sister     breast  . Heart attack Paternal Uncle     SOCIAL HISTORY: Social History   Social History  . Marital status: Married    Spouse name: Johnnie  . Number of children: 1  . Years of education: N/A   Occupational History  .      retired   Social History Main Topics  . Smoking status: Never Smoker  . Smokeless tobacco: Never Used  . Alcohol use No  . Drug use: No  . Sexual activity: Not on file   Other Topics Concern  . Not on file   Social History Narrative   Patient lives at home with her husband [name](Johhnie ) and [name].   Retired.   Caffeine- half cup of coffee.   Right handed.           PHYSICAL EXAM  Vitals:   05/31/16 1404  BP: 138/79  Pulse: 78  Weight: 213 lb (96.6 kg)  Height: 5\' 4"  (1.626 m)   Body mass index is 36.56 kg/m. Generalized: Well developed,obese female in no acute distress  Head: normocephalic and atraumatic,. Oropharynx benign  Neck: Supple, no carotid bruits  Cardiac: Regular rate rhythm, no murmur  Musculoskeletal: No deformity   Neurological examination   Mentation: Awake, cooperative on examination, Mini-Mental Status Examination 18/30, animal naming 4.     Speech is normal; fluent and spontaneous with normal comprehension.  Cognition:     Orientation: She is not oriented to time and place     recent and remote memory: She missed 2 out of 3 recalls,      Attention span and concentration: She could not spell world backwards     Normal Language, naming, repeating,spontaneous speech, she could not copy [employer]     Fund of knowledge   Cranial nerve II-XII: Fundoscopic exam reveals sharp disc margins.Pupils were equal round reactive to light extraocular movements were full, visual field were full on confrontational test. Facial sensation and strength were normal. hearing was intact to finger rubbing bilaterally. Uvula tongue midline. head  turning and shoulder shrug were normal and symmetric.Tongue protrusion into cheek strength was normal. Motor: normal bulk and tone, full strength in the BUE, BLE, fine finger movements normal, no pronator drift. No focal weakness Sensory: normal and symmetric to light touch, pinprick, and Vibration, proprioception  Coordination: finger-nose-finger, heel-to-shin bilaterally, no dysmetria Reflexes: Brachioradialis 2/2, biceps 2/2, triceps 2/2, patellar 2/2, Achilles 2/2, plantar responses were flexor bilaterally. Gait and Station: Rising up from seated position without assistance, normal stance, moderate stride, good arm swing, smooth turning, able to perform tiptoe, and heel walking without difficulty. Tandem gait is mildly unsteady. No assistive device.  Apley's maneuver: She complains of dizziness with left ear dependent position, but I was not able to appreciate any nystagmus  DIAGNOSTIC  DATA (LABS, IMAGING, TESTING) - ASSESSMENT AND PLAN 81 y.o. year old female Mild to Moderate Alzheimer's dementia  Continue on Namenda 10 mg twice a day, Aricept 23 mg tablets New-onset vertigo  She seems to have positional induced intense vertigo though I could not appreciate nystagmus, suggestive of peripheral vestibulitis  She wants to hold of vestibular rehabilitation at this point,  No acute findings on MRI of brain,  I have suggested her home repositional maneuver  Levert Feinstein, M.D. Ph.D.  [facility] [address] [place], [place] [address] Phone: [phone] Fax:      [phone]

## 2016-06-02 DIAGNOSIS — H401131 Primary open-angle glaucoma, bilateral, mild stage: Secondary | ICD-10-CM | POA: Diagnosis not present

## 2016-06-06 DIAGNOSIS — J301 Allergic rhinitis due to pollen: Secondary | ICD-10-CM | POA: Diagnosis not present

## 2016-06-06 DIAGNOSIS — J3089 Other allergic rhinitis: Secondary | ICD-10-CM | POA: Diagnosis not present

## 2016-06-15 DIAGNOSIS — Z23 Encounter for immunization: Secondary | ICD-10-CM | POA: Diagnosis not present

## 2016-06-20 DIAGNOSIS — J301 Allergic rhinitis due to pollen: Secondary | ICD-10-CM | POA: Diagnosis not present

## 2016-06-20 DIAGNOSIS — J3089 Other allergic rhinitis: Secondary | ICD-10-CM | POA: Diagnosis not present

## 2016-07-05 DIAGNOSIS — J3089 Other allergic rhinitis: Secondary | ICD-10-CM | POA: Diagnosis not present

## 2016-07-05 DIAGNOSIS — J301 Allergic rhinitis due to pollen: Secondary | ICD-10-CM | POA: Diagnosis not present

## 2016-07-13 ENCOUNTER — Other Ambulatory Visit: Payer: Self-pay | Admitting: Nurse Practitioner

## 2016-07-19 DIAGNOSIS — J3089 Other allergic rhinitis: Secondary | ICD-10-CM | POA: Diagnosis not present

## 2016-07-19 DIAGNOSIS — J301 Allergic rhinitis due to pollen: Secondary | ICD-10-CM | POA: Diagnosis not present

## 2016-08-01 DIAGNOSIS — J301 Allergic rhinitis due to pollen: Secondary | ICD-10-CM | POA: Diagnosis not present

## 2016-08-01 DIAGNOSIS — J3089 Other allergic rhinitis: Secondary | ICD-10-CM | POA: Diagnosis not present

## 2016-08-15 ENCOUNTER — Other Ambulatory Visit: Payer: Self-pay | Admitting: Nurse Practitioner

## 2016-08-15 DIAGNOSIS — J3089 Other allergic rhinitis: Secondary | ICD-10-CM | POA: Diagnosis not present

## 2016-08-15 DIAGNOSIS — J301 Allergic rhinitis due to pollen: Secondary | ICD-10-CM | POA: Diagnosis not present

## 2016-08-29 DIAGNOSIS — J301 Allergic rhinitis due to pollen: Secondary | ICD-10-CM | POA: Diagnosis not present

## 2016-08-29 DIAGNOSIS — J3089 Other allergic rhinitis: Secondary | ICD-10-CM | POA: Diagnosis not present

## 2016-09-01 DIAGNOSIS — Z961 Presence of intraocular lens: Secondary | ICD-10-CM | POA: Diagnosis not present

## 2016-09-01 DIAGNOSIS — H401131 Primary open-angle glaucoma, bilateral, mild stage: Secondary | ICD-10-CM | POA: Diagnosis not present

## 2016-09-01 DIAGNOSIS — H524 Presbyopia: Secondary | ICD-10-CM | POA: Diagnosis not present

## 2016-09-12 DIAGNOSIS — J3089 Other allergic rhinitis: Secondary | ICD-10-CM | POA: Diagnosis not present

## 2016-09-12 DIAGNOSIS — J301 Allergic rhinitis due to pollen: Secondary | ICD-10-CM | POA: Diagnosis not present

## 2016-10-14 ENCOUNTER — Telehealth: Payer: Self-pay | Admitting: *Deleted

## 2016-10-14 NOTE — Telephone Encounter (Signed)
Donepezil PA approved by [facility] - pt ON#[id number] - through 10/11/17.

## 2016-10-17 DIAGNOSIS — J3089 Other allergic rhinitis: Secondary | ICD-10-CM | POA: Diagnosis not present

## 2016-10-17 DIAGNOSIS — J301 Allergic rhinitis due to pollen: Secondary | ICD-10-CM | POA: Diagnosis not present

## 2016-10-21 DIAGNOSIS — R32 Unspecified urinary incontinence: Secondary | ICD-10-CM | POA: Diagnosis not present

## 2016-10-21 DIAGNOSIS — R609 Edema, unspecified: Secondary | ICD-10-CM | POA: Diagnosis not present

## 2016-10-21 DIAGNOSIS — L0390 Cellulitis, unspecified: Secondary | ICD-10-CM | POA: Diagnosis not present

## 2016-11-04 DIAGNOSIS — R609 Edema, unspecified: Secondary | ICD-10-CM | POA: Diagnosis not present

## 2016-11-07 DIAGNOSIS — J301 Allergic rhinitis due to pollen: Secondary | ICD-10-CM | POA: Diagnosis not present

## 2016-11-07 DIAGNOSIS — J3089 Other allergic rhinitis: Secondary | ICD-10-CM | POA: Diagnosis not present

## 2016-11-21 DIAGNOSIS — J3089 Other allergic rhinitis: Secondary | ICD-10-CM | POA: Diagnosis not present

## 2016-11-21 DIAGNOSIS — J301 Allergic rhinitis due to pollen: Secondary | ICD-10-CM | POA: Diagnosis not present

## 2016-11-28 DIAGNOSIS — J3089 Other allergic rhinitis: Secondary | ICD-10-CM | POA: Diagnosis not present

## 2016-11-28 DIAGNOSIS — J301 Allergic rhinitis due to pollen: Secondary | ICD-10-CM | POA: Diagnosis not present

## 2016-11-29 ENCOUNTER — Encounter (INDEPENDENT_AMBULATORY_CARE_PROVIDER_SITE_OTHER): Payer: Self-pay

## 2016-11-29 ENCOUNTER — Ambulatory Visit (INDEPENDENT_AMBULATORY_CARE_PROVIDER_SITE_OTHER): Payer: Medicare Other | Admitting: Neurology

## 2016-11-29 ENCOUNTER — Encounter: Payer: Self-pay | Admitting: Neurology

## 2016-11-29 VITALS — BP 171/88 | HR 68 | Ht 64.0 in | Wt 228.0 lb

## 2016-11-29 DIAGNOSIS — G300 Alzheimer's disease with early onset: Secondary | ICD-10-CM

## 2016-11-29 DIAGNOSIS — F0280 Dementia in other diseases classified elsewhere without behavioral disturbance: Secondary | ICD-10-CM

## 2016-11-29 MED ORDER — DONEPEZIL HCL 23 MG PO TABS: ORAL_TABLET | ORAL | 4 refills | Status: DC

## 2016-11-29 MED ORDER — MEMANTINE HCL 10 MG PO TABS: 10.0000 mg | ORAL_TABLET | Freq: Two times a day (BID) | ORAL | 4 refills | Status: DC

## 2016-11-29 NOTE — Progress Notes (Signed)
[facility]  PATIENT: Savannah Hinton DOB: 10/01/1934  HISTORY OF PRESENT ILLNESS:Savannah Hinton is a 82 years old right-handed Caucasian female, referred by her primary care physician Dr. Marinda Elk, for evaluation of memory loss. Initial evaluation was February 2016  She had past medical history of hypertension, bilateral cataract surgery, right hand surgery, had 12 years of education, is a retired [employer] operational officer.  Since 2013, she was noticed to have mild word finding difficulties, memory trouble, difficulty working with members, difficulty to balance her checkbook, she denied visual loss, she has good appetite, sleeping well, she has occasionally diarrhea with greasy food.   Both of her parents died of stroke at age 90s, her older sister died of Alzheimer's at age 17   MRI of the brain in 2014, demonstrated moderate atrophy, mainly involving bilateral frontal, parietal, temporal region, mild small vessel disease, laboratory showed low normal B12, 220, rest of the laboratory was normal including RPR, TSH, ANA, CMP with exception of mild elevated glucose 130, She is taking B12 supplement by mouth. She does not exercise. Appetite is reportedly good she is sleeping well. She denies side effects to Aricept .   She continues to drive short distances with out getting lost. No longer cooks. Independent in dressing bathing and eating. She returns for reevaluation  UPDATE Sep 5th 2017: She is with her husband, she went to ED on July 17th 2017, she had sudden onset vertigo, lasted for few minutes.  She has been sleeping on recliner, scared to have recurrent vertigo with sudden positional change such as lying down,  I personally reviewed MRI July 17th 2017, there was no acute abnormality, mild cerebral atrophy and small vessel disease,  Laboratory evaluations, normal INR CMP, with exception of mild elevated glucose 109, normal CBC  She has slow worsening memory loss, no longer cooking,  able to do simple house chore, able to maintain is no hygiene, dressing independently,  Update November 29 2016: She is here with her husband, overall doing very well, taking Aricept 23 mg daily, Namenda 10 mg twice a day, slight worsening of her memory loss, Mini-Mental Status Examination 16/30, animal naming 4.  REVIEW OF SYSTEMS: Full 14 system review of systems performed and notable only for those listed, all  Others are negative.   ALLERGIES: Allergies  Allergen Reactions  . Codeine     hallucinations  . Penicillins Other (See Comments)    Yeast infection     HOME MEDICATIONS: Outpatient Medications Prior to Visit  Medication Sig Dispense Refill  . acetaminophen (TYLENOL) 500 MG tablet Take 500 mg by mouth every 6 (six) hours as needed for mild pain.    Marland Kitchen apixaban (ELIQUIS) 5 MG TABS tablet Take 1 tablet (5 mg total) by mouth 2 (two) times daily. 60 tablet 6  . Cyanocobalamin (VITAMIN B 12 PO) Take 1 tablet by mouth daily.     Marland Kitchen diltiazem (CARTIA XT) 180 MG 24 hr capsule Take 1 capsule (180 mg total) by mouth daily. 30 capsule 6  . donepezil (ARICEPT) 23 MG TABS tablet TAKE 1 TABLET (23 MG TOTAL) BY MOUTH DAILY. 90 tablet 4  . lisinopril (PRINIVIL,ZESTRIL) 20 MG tablet TAKE 1 TABLET (20 MG TOTAL) BY MOUTH 2 (TWO) TIMES DAILY 60 tablet 6  . meclizine (ANTIVERT) 25 MG tablet Take 1 tablet (25 mg total) by mouth 3 (three) times daily as needed for dizziness. 30 tablet 0  . memantine (NAMENDA) 10 MG tablet Take 1 tablet (10 mg total) by  mouth 2 (two) times daily. 60 tablet 11  . NON FORMULARY every 14 (fourteen) days. allergy shots    . timolol (TIMOPTIC) 0.5 % ophthalmic solution Place 1 drop into both eyes 2 (two) times daily.     . hydrochlorothiazide (HYDRODIURIL) 25 MG tablet TAKE 1 TABLET (25 MG TOTAL) BY MOUTH DAILY. 30 tablet 6   No facility-administered medications prior to visit.     PAST MEDICAL HISTORY: Past Medical History:  Diagnosis Date  . Allergy   . Atrial  fibrillation (HCC)   . Dementia   . Glaucoma   . Hyperlipidemia   . Hypertension   . Memory loss   . Vertigo     PAST SURGICAL HISTORY: Past Surgical History:  Procedure Laterality Date  . APPENDECTOMY  1974  . CHOLECYSTECTOMY  2012  . RETINAL DETACHMENT SURGERY      FAMILY HISTORY: Family History  Problem Relation Age of Onset  . Stroke Mother   . Stroke Father   . Cancer Sister     breast  . Heart attack Paternal Uncle     SOCIAL HISTORY: Social History   Social History  . Marital status: Married    Spouse name: Johnnie  . Number of children: 1  . Years of education: N/A   Occupational History  .      retired   Social History Main Topics  . Smoking status: Never Smoker  . Smokeless tobacco: Never Used  . Alcohol use No  . Drug use: No  . Sexual activity: Not on file   Other Topics Concern  . Not on file   Social History Narrative   Patient lives at home with her husband [name](Johhnie ) and [name].   Retired.   Caffeine- half cup of coffee.   Right handed.           PHYSICAL EXAM  Vitals:   11/29/16 1343  BP: (!) 171/88  Pulse: 68  Weight: 228 lb (103.4 kg)  Height: 5\' 4"  (1.626 m)   Body mass index is 39.14 kg/m. Generalized: Well developed,obese female in no acute distress  Head: normocephalic and atraumatic,. Oropharynx benign  Neck: Supple, no carotid bruits  Cardiac: Regular rate rhythm, no murmur  Musculoskeletal: No deformity   Neurological examination   Mentation: Awake, cooperative on examination,     Speech is normal; fluent and spontaneous with normal comprehension.  Cognition:Mini-Mental Status Examination 16/30, animal naming 4     Orientation: She is not oriented to time and place     recent and remote memory: She missed 3 out of 3 recalls,      Attention span and concentration: She could not spell world backwards     Normal Language:  has difficulty repeating, she could not copy design,      Fund of knowledge   Cranial nerve II-XII: Fundoscopic exam reveals sharp disc margins.Pupils were equal round reactive to light extraocular movements were full, visual field were full on confrontational test. Facial sensation and strength were normal. hearing was intact to finger rubbing bilaterally. Uvula tongue midline. head turning and shoulder shrug were normal and symmetric.Tongue protrusion into cheek strength was normal. Motor: normal bulk and tone, full strength in the BUE, BLE, fine finger movements normal, no pronator drift. No focal weakness Sensory: normal and symmetric to light touch, pinprick, and Vibration, proprioception  Coordination: finger-nose-finger, heel-to-shin bilaterally, no dysmetria Reflexes: Brachioradialis 2/2, biceps 2/2, triceps 2/2, patellar 2/2, Achilles 2/2, plantar  responses were flexor bilaterally. Gait and Station: Rising up from seated position without assistance, normal stance, moderate stride, good arm swing, smooth turning, able to perform tiptoe, and heel walking without difficulty. Tandem gait is mildly unsteady. No assistive device.  Apley's maneuver: She complains of dizziness with left ear dependent position, but I was not able to appreciate any nystagmus  DIAGNOSTIC DATA (LABS, IMAGING, TESTING) - ASSESSMENT AND PLAN 82 y.o. year old female Mild to Moderate Alzheimer's dementia  Continue on Namenda 10 mg twice a day, Aricept 23 mg tablets  MMSE 16/30  Husband denies significant agitations    Levert Feinstein, M.D. Ph.D.  [facility] [address] [place], [place] [address] Phone: [phone] Fax:      [phone]

## 2016-12-02 ENCOUNTER — Encounter (HOSPITAL_BASED_OUTPATIENT_CLINIC_OR_DEPARTMENT_OTHER): Payer: Self-pay | Admitting: Emergency Medicine

## 2016-12-02 ENCOUNTER — Inpatient Hospital Stay (HOSPITAL_BASED_OUTPATIENT_CLINIC_OR_DEPARTMENT_OTHER)
Admission: EM | Admit: 2016-12-02 | Discharge: 2016-12-05 | DRG: 291 | Disposition: A | Discharge status: Home / Self Care | Payer: Medicare Other | Attending: Internal Medicine | Admitting: Internal Medicine

## 2016-12-02 ENCOUNTER — Emergency Department (HOSPITAL_BASED_OUTPATIENT_CLINIC_OR_DEPARTMENT_OTHER): Payer: Medicare Other

## 2016-12-02 DIAGNOSIS — I482 Chronic atrial fibrillation: Secondary | ICD-10-CM | POA: Diagnosis present

## 2016-12-02 DIAGNOSIS — Z880 Allergy status to penicillin: Secondary | ICD-10-CM | POA: Diagnosis not present

## 2016-12-02 DIAGNOSIS — I517 Cardiomegaly: Secondary | ICD-10-CM | POA: Diagnosis not present

## 2016-12-02 DIAGNOSIS — Z823 Family history of stroke: Secondary | ICD-10-CM | POA: Diagnosis not present

## 2016-12-02 DIAGNOSIS — Z7901 Long term (current) use of anticoagulants: Secondary | ICD-10-CM

## 2016-12-02 DIAGNOSIS — R600 Localized edema: Secondary | ICD-10-CM | POA: Diagnosis not present

## 2016-12-02 DIAGNOSIS — J181 Lobar pneumonia, unspecified organism: Secondary | ICD-10-CM | POA: Diagnosis present

## 2016-12-02 DIAGNOSIS — F0280 Dementia in other diseases classified elsewhere without behavioral disturbance: Secondary | ICD-10-CM | POA: Diagnosis present

## 2016-12-02 DIAGNOSIS — R609 Edema, unspecified: Secondary | ICD-10-CM

## 2016-12-02 DIAGNOSIS — G300 Alzheimer's disease with early onset: Secondary | ICD-10-CM | POA: Diagnosis not present

## 2016-12-02 DIAGNOSIS — R262 Difficulty in walking, not elsewhere classified: Secondary | ICD-10-CM | POA: Diagnosis present

## 2016-12-02 DIAGNOSIS — Z8249 Family history of ischemic heart disease and other diseases of the circulatory system: Secondary | ICD-10-CM

## 2016-12-02 DIAGNOSIS — R32 Unspecified urinary incontinence: Secondary | ICD-10-CM | POA: Diagnosis present

## 2016-12-02 DIAGNOSIS — R42 Dizziness and giddiness: Secondary | ICD-10-CM | POA: Diagnosis present

## 2016-12-02 DIAGNOSIS — Z79899 Other long term (current) drug therapy: Secondary | ICD-10-CM

## 2016-12-02 DIAGNOSIS — I509 Heart failure, unspecified: Secondary | ICD-10-CM | POA: Diagnosis not present

## 2016-12-02 DIAGNOSIS — Z886 Allergy status to analgesic agent status: Secondary | ICD-10-CM | POA: Diagnosis not present

## 2016-12-02 DIAGNOSIS — I4891 Unspecified atrial fibrillation: Secondary | ICD-10-CM | POA: Diagnosis present

## 2016-12-02 DIAGNOSIS — H409 Unspecified glaucoma: Secondary | ICD-10-CM | POA: Diagnosis present

## 2016-12-02 DIAGNOSIS — G309 Alzheimer's disease, unspecified: Secondary | ICD-10-CM | POA: Diagnosis present

## 2016-12-02 DIAGNOSIS — Z9049 Acquired absence of other specified parts of digestive tract: Secondary | ICD-10-CM

## 2016-12-02 DIAGNOSIS — I5031 Acute diastolic (congestive) heart failure: Secondary | ICD-10-CM | POA: Diagnosis present

## 2016-12-02 DIAGNOSIS — F05 Delirium due to known physiological condition: Secondary | ICD-10-CM | POA: Diagnosis present

## 2016-12-02 DIAGNOSIS — I10 Essential (primary) hypertension: Secondary | ICD-10-CM | POA: Diagnosis present

## 2016-12-02 DIAGNOSIS — I110 Hypertensive heart disease with heart failure: Secondary | ICD-10-CM | POA: Diagnosis not present

## 2016-12-02 DIAGNOSIS — Z809 Family history of malignant neoplasm, unspecified: Secondary | ICD-10-CM | POA: Diagnosis not present

## 2016-12-02 DIAGNOSIS — J810 Acute pulmonary edema: Secondary | ICD-10-CM | POA: Diagnosis not present

## 2016-12-02 DIAGNOSIS — J189 Pneumonia, unspecified organism: Secondary | ICD-10-CM | POA: Diagnosis present

## 2016-12-02 DIAGNOSIS — J90 Pleural effusion, not elsewhere classified: Secondary | ICD-10-CM | POA: Diagnosis not present

## 2016-12-02 LAB — CBC WITH DIFFERENTIAL/PLATELET
BASOS PCT: 0 %
Basophils Absolute: 0 10*3/uL (ref 0.0–0.1)
Eosinophils Absolute: 0 10*3/uL (ref 0.0–0.7)
Eosinophils Relative: 1 %
HEMATOCRIT: 39.5 % (ref 36.0–46.0)
Hemoglobin: 12.6 g/dL (ref 12.0–15.0)
Lymphocytes Relative: 13 %
Lymphs Abs: 1.1 10*3/uL (ref 0.7–4.0)
MCH: 28.1 pg (ref 26.0–34.0)
MCHC: 31.9 g/dL (ref 30.0–36.0)
MCV: 88.2 fL (ref 78.0–100.0)
MONO ABS: 0.9 10*3/uL (ref 0.1–1.0)
MONOS PCT: 11 %
NEUTROS ABS: 6.3 10*3/uL (ref 1.7–7.7)
Neutrophils Relative %: 75 %
Platelets: 260 10*3/uL (ref 150–400)
RBC: 4.48 MIL/uL (ref 3.87–5.11)
RDW: 15.5 % (ref 11.5–15.5)
WBC: 8.4 10*3/uL (ref 4.0–10.5)

## 2016-12-02 LAB — COMPREHENSIVE METABOLIC PANEL
ALK PHOS: 89 U/L (ref 38–126)
ALT: 20 U/L (ref 14–54)
ANION GAP: 7 (ref 5–15)
AST: 19 U/L (ref 15–41)
Albumin: 3.5 g/dL (ref 3.5–5.0)
BILIRUBIN TOTAL: 0.8 mg/dL (ref 0.3–1.2)
BUN: 17 mg/dL (ref 6–20)
CALCIUM: 8.7 mg/dL — AB (ref 8.9–10.3)
CO2: 28 mmol/L (ref 22–32)
Chloride: 105 mmol/L (ref 101–111)
Creatinine, Ser: 0.89 mg/dL (ref 0.44–1.00)
GFR calc non Af Amer: >60 mL/min (ref >60)
Glucose, Bld: 131 mg/dL — ABNORMAL HIGH (ref 65–99)
Potassium: 3.7 mmol/L (ref 3.5–5.1)
SODIUM: 140 mmol/L (ref 135–145)
TOTAL PROTEIN: 7.2 g/dL (ref 6.5–8.1)

## 2016-12-02 LAB — TROPONIN I
Troponin I: <0.03 ng/mL (ref <0.03)
Troponin I: <0.03 ng/mL (ref <0.03)

## 2016-12-02 LAB — URINALYSIS, ROUTINE W REFLEX MICROSCOPIC
Bilirubin Urine: NEGATIVE
Glucose, UA: NEGATIVE mg/dL
Hgb urine dipstick: NEGATIVE
KETONES UR: NEGATIVE mg/dL
LEUKOCYTES UA: NEGATIVE
NITRITE: NEGATIVE
PROTEIN: 30 mg/dL — AB
Specific Gravity, Urine: 1.018 (ref 1.005–1.030)
pH: 6.5 (ref 5.0–8.0)

## 2016-12-02 LAB — PROCALCITONIN

## 2016-12-02 LAB — INFLUENZA PANEL BY PCR (TYPE A & B)
Influenza A By PCR: NEGATIVE
Influenza B By PCR: NEGATIVE

## 2016-12-02 LAB — BRAIN NATRIURETIC PEPTIDE: B Natriuretic Peptide: 201.8 pg/mL — ABNORMAL HIGH (ref 0.0–100.0)

## 2016-12-02 LAB — URINALYSIS, MICROSCOPIC (REFLEX)
RBC / HPF: NONE SEEN RBC/hpf (ref 0–5)
WBC UA: NONE SEEN WBC/hpf (ref 0–5)

## 2016-12-02 MED ORDER — ONDANSETRON HCL 4 MG/2ML IJ SOLN: 4.0000 mg | Freq: Four times a day (QID) | INTRAMUSCULAR | Status: DC | PRN

## 2016-12-02 MED ORDER — POLYETHYLENE GLYCOL 3350 17 G PO PACK: 17.0000 g | PACK | Freq: Every day | ORAL | Status: DC | PRN

## 2016-12-02 MED ORDER — FUROSEMIDE 10 MG/ML IJ SOLN
40.0000 mg | Freq: Every day | INTRAMUSCULAR | Status: DC
  Filled 2016-12-02: qty 4

## 2016-12-02 MED ORDER — IPRATROPIUM-ALBUTEROL 0.5-2.5 (3) MG/3ML IN SOLN
3.0000 mL | Freq: Four times a day (QID) | RESPIRATORY_TRACT | Status: DC
  Administered 2016-12-02 (×2): 3 mL via RESPIRATORY_TRACT
  Filled 2016-12-02 (×2): qty 3

## 2016-12-02 MED ORDER — ONDANSETRON HCL 4 MG PO TABS: 4.0000 mg | ORAL_TABLET | Freq: Four times a day (QID) | ORAL | Status: DC | PRN

## 2016-12-02 MED ORDER — AZITHROMYCIN 250 MG PO TABS
500.0000 mg | ORAL_TABLET | Freq: Once | ORAL | Status: AC
  Administered 2016-12-02: 500 mg via ORAL
  Filled 2016-12-02: qty 2

## 2016-12-02 MED ORDER — GUAIFENESIN ER 600 MG PO TB12
600.0000 mg | ORAL_TABLET | Freq: Two times a day (BID) | ORAL | Status: DC
  Administered 2016-12-02 – 2016-12-05 (×6): 600 mg via ORAL
  Filled 2016-12-02 (×6): qty 1

## 2016-12-02 MED ORDER — SODIUM CHLORIDE 0.9 % IV SOLN: 250.0000 mL | INTRAVENOUS | Status: DC | PRN

## 2016-12-02 MED ORDER — CEFTRIAXONE SODIUM 1 G IJ SOLR
1.0000 g | INTRAMUSCULAR | Status: DC
Start: 2016-12-03 — End: 2016-12-03
  Filled 2016-12-02: qty 10

## 2016-12-02 MED ORDER — DILTIAZEM HCL ER COATED BEADS 180 MG PO CP24
180.0000 mg | ORAL_CAPSULE | Freq: Every day | ORAL | Status: DC
  Administered 2016-12-02 – 2016-12-04 (×3): 180 mg via ORAL
  Filled 2016-12-02 (×3): qty 1

## 2016-12-02 MED ORDER — ACETAMINOPHEN 650 MG RE SUPP: 650.0000 mg | Freq: Four times a day (QID) | RECTAL | Status: DC | PRN

## 2016-12-02 MED ORDER — DEXTROSE 5 % IV SOLN
1.0000 g | Freq: Once | INTRAVENOUS | Status: AC
  Administered 2016-12-02: 1 g via INTRAVENOUS
  Filled 2016-12-02: qty 10

## 2016-12-02 MED ORDER — APIXABAN 5 MG PO TABS
5.0000 mg | ORAL_TABLET | Freq: Two times a day (BID) | ORAL | Status: DC
  Administered 2016-12-02 – 2016-12-05 (×6): 5 mg via ORAL
  Filled 2016-12-02 (×6): qty 1

## 2016-12-02 MED ORDER — LISINOPRIL 20 MG PO TABS
20.0000 mg | ORAL_TABLET | Freq: Every day | ORAL | Status: DC
  Administered 2016-12-03: 20 mg via ORAL
  Filled 2016-12-02: qty 1

## 2016-12-02 MED ORDER — DONEPEZIL HCL 23 MG PO TABS
23.0000 mg | ORAL_TABLET | Freq: Every day | ORAL | Status: DC
  Administered 2016-12-02 – 2016-12-04 (×3): 23 mg via ORAL
  Filled 2016-12-02 (×3): qty 1

## 2016-12-02 MED ORDER — MEMANTINE HCL 10 MG PO TABS
10.0000 mg | ORAL_TABLET | Freq: Two times a day (BID) | ORAL | Status: DC
  Administered 2016-12-02 – 2016-12-05 (×6): 10 mg via ORAL
  Filled 2016-12-02 (×6): qty 1

## 2016-12-02 MED ORDER — SODIUM CHLORIDE 0.9% FLUSH: 3.0000 mL | INTRAVENOUS | Status: DC | PRN

## 2016-12-02 MED ORDER — HYDROCODONE-ACETAMINOPHEN 5-325 MG PO TABS: 1.0000 | ORAL_TABLET | ORAL | Status: DC | PRN

## 2016-12-02 MED ORDER — ACETAMINOPHEN 325 MG PO TABS: 650.0000 mg | ORAL_TABLET | Freq: Four times a day (QID) | ORAL | Status: DC | PRN

## 2016-12-02 MED ORDER — MECLIZINE HCL 25 MG PO TABS: 25.0000 mg | ORAL_TABLET | Freq: Three times a day (TID) | ORAL | Status: DC | PRN

## 2016-12-02 MED ORDER — SODIUM CHLORIDE 0.9% FLUSH
3.0000 mL | Freq: Two times a day (BID) | INTRAVENOUS | Status: DC
  Administered 2016-12-03 – 2016-12-05 (×6): 3 mL via INTRAVENOUS

## 2016-12-02 MED ORDER — AZITHROMYCIN 250 MG PO TABS
500.0000 mg | ORAL_TABLET | Freq: Every day | ORAL | Status: DC
  Filled 2016-12-02: qty 2

## 2016-12-02 MED ORDER — FUROSEMIDE 10 MG/ML IJ SOLN
20.0000 mg | Freq: Once | INTRAMUSCULAR | Status: AC
  Administered 2016-12-02: 20 mg via INTRAVENOUS
  Filled 2016-12-02: qty 2

## 2016-12-02 MED ORDER — ALBUTEROL SULFATE (2.5 MG/3ML) 0.083% IN NEBU: 2.5000 mg | INHALATION_SOLUTION | Freq: Four times a day (QID) | RESPIRATORY_TRACT | Status: DC | PRN

## 2016-12-02 MED ORDER — TIMOLOL MALEATE 0.5 % OP SOLN
1.0000 [drp] | Freq: Two times a day (BID) | OPHTHALMIC | Status: DC
  Administered 2016-12-02 – 2016-12-05 (×6): 1 [drp] via OPHTHALMIC
  Filled 2016-12-02: qty 5

## 2016-12-02 MED ORDER — SODIUM CHLORIDE 0.9% FLUSH
3.0000 mL | Freq: Two times a day (BID) | INTRAVENOUS | Status: DC
  Administered 2016-12-03 – 2016-12-05 (×4): 3 mL via INTRAVENOUS

## 2016-12-02 MED ORDER — LEVALBUTEROL HCL 0.63 MG/3ML IN NEBU: 0.6300 mg | INHALATION_SOLUTION | Freq: Four times a day (QID) | RESPIRATORY_TRACT | Status: DC | PRN

## 2016-12-02 NOTE — ED Notes (Signed)
[facility], tol well, gait unsteady

## 2016-12-02 NOTE — ED Notes (Signed)
Missed IV attempts x 2, to right hand and RAC, tol well.

## 2016-12-02 NOTE — ED Provider Notes (Signed)
[facility] Provider Note   CSN: [id number] Arrival date & time: 12/02/16  1014     History   Chief Complaint Chief Complaint  Patient presents with  . Weakness    HPI Savannah Hinton is a 82 y.o. female.  HPI Patient presents to the emergency room with general malaise and weakness over the last couple of days. History is provided by the patient and her husband. Patient has a history of dementia as well as peripheral edema and atrial fibrillation. Over the last couple weeks she said adjustments in her diuretics. Patient had been taken off of diuretics but was noticing increasing leg swelling. Her doctor put her back on Lasix about a week or so ago. She had some improvement of her leg swelling although it does persist. Over the last couple of days has been feels like the patient seems to be more anxious. She's felt sick. She seems to be weaker than usual and has more difficulty walking. She has not had any fevers. She has had a bit of a cough. Patient denies any trouble with any chest pain or abdominal pain. She does not mention any issues with your urine however her husband states she's noticed an abnormal strong odor to the urine is concerned about the possibility of infection. Past Medical History:  Diagnosis Date  . Allergy   . Atrial fibrillation (HCC)   . Dementia   . Glaucoma   . Hyperlipidemia   . Hypertension   . Memory loss   . Vertigo     Patient Active Problem List   Diagnosis Date Noted  . Vertigo 05/31/2016  . A-fib (HCC) 02/24/2014  . HTN (hypertension) 02/24/2014  . Chronic venous insufficiency 02/24/2014  . Chronic anticoagulation 02/24/2014  . 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 Memory loss   . Cognitive impairment 07/11/2013  . Chronic cholecystitis with calculus 06/16/2011  . INFECTIOUS DIARRHEA 10/07/2009  . DIARRHEA-PRESUMED INFECTIOUS 10/07/2009  . HYPERLIPIDEMIA 10/07/2009  . Essential hypertension  10/07/2009  . GERD 10/07/2009  . DIVERTICULOSIS-COLON 10/07/2009  . DIVERTICULITIS OF COLON 10/07/2009  . RECTAL BLEEDING 10/07/2009  . NAUSEA 10/07/2009    Past Surgical History:  Procedure Laterality Date  . APPENDECTOMY  1974  . CHOLECYSTECTOMY  2012  . RETINAL DETACHMENT SURGERY      OB History    No data available       Home Medications    Prior to Admission medications   Medication Sig Start Date End Date Taking? Authorizing Provider  acetaminophen (TYLENOL) 500 MG tablet Take 500 mg by mouth every 6 (six) hours as needed for mild pain.   Yes Historical Provider, MD  apixaban (ELIQUIS) 5 MG TABS tablet Take 1 tablet (5 mg total) by mouth 2 (two) times daily. 05/17/16  Yes Jake Bathe, MD  Cyanocobalamin (VITAMIN B 12 PO) Take 1 tablet by mouth daily.    Yes Historical Provider, MD  diltiazem (CARTIA XT) 180 MG 24 hr capsule Take 1 capsule (180 mg total) by mouth daily. 05/17/16  Yes Jake Bathe, MD  donepezil (ARICEPT) 23 MG TABS tablet TAKE 1 TABLET (23 MG TOTAL) BY MOUTH DAILY. 11/29/16  Yes Levert Feinstein, MD  furosemide (LASIX) 20 MG tablet Take 40 mg by mouth daily. 11/04/16  Yes Historical Provider, MD  lisinopril (PRINIVIL,ZESTRIL) 20 MG tablet TAKE 1 TABLET (20 MG TOTAL) BY MOUTH 2 (TWO) TIMES DAILY 05/17/16  Yes Jake Bathe, MD  meclizine (ANTIVERT) 25  MG tablet Take 1 tablet (25 mg total) by mouth 3 (three) times daily as needed for dizziness. 04/11/16  Yes Arby Barrette, MD  memantine (NAMENDA) 10 MG tablet Take 1 tablet (10 mg total) by mouth 2 (two) times daily. 11/29/16  Yes Levert Feinstein, MD  timolol (TIMOPTIC) 0.5 % ophthalmic solution Place 1 drop into both eyes 2 (two) times daily.  06/02/11  Yes Historical Provider, MD  NON FORMULARY every 14 (fourteen) days. allergy shots    Historical Provider, MD    Family History Family History  Problem Relation Age of Onset  . Stroke Mother   . Stroke Father   . Cancer Sister     breast  . Heart attack Paternal Uncle      Social History Social History  Substance Use Topics  . Smoking status: Never Smoker  . Smokeless tobacco: Never Used  . Alcohol use No     Allergies   Codeine and Penicillins   Review of Systems Review of Systems  All other systems reviewed and are negative.    Physical Exam Updated Vital Signs BP 158/64   Pulse 71   Temp 98.4 F (36.9 C) (Oral)   Resp 17   Ht 5\' 4"  (1.626 m)   Wt 103.4 kg   SpO2 96%   BMI 39.14 kg/m   Physical Exam  Constitutional: No distress.  hypertensive  HENT:  Head: Normocephalic and atraumatic.  Right Ear: External ear normal.  Left Ear: External ear normal.  Eyes: Conjunctivae are normal. Right eye exhibits no discharge. Left eye exhibits no discharge. No scleral icterus.  Neck: Neck supple. No tracheal deviation present.  Cardiovascular: Normal rate and intact distal pulses.  An irregularly irregular rhythm present.  Pulmonary/Chest: Effort normal and breath sounds normal. No stridor. No respiratory distress. She has no wheezes. She has no rales.  Abdominal: Soft. Bowel sounds are normal. She exhibits no distension. There is no tenderness. There is no rebound and no guarding.  Musculoskeletal: She exhibits edema. She exhibits no tenderness.  Tense edema bilateral lower extremities up to thighs, erythema bilaterally  Neurological: She is alert. She has normal strength. No cranial nerve deficit (no facial droop, extraocular movements intact, no slurred speech) or sensory deficit. She exhibits normal muscle tone. She displays no seizure activity. Coordination normal.  Skin: Skin is warm and dry. No rash noted.  Psychiatric: She has a normal mood and affect.  Nursing note and vitals reviewed.    ED Treatments / Results  Labs (all labs ordered are listed, but only abnormal results are displayed) Labs Reviewed  COMPREHENSIVE METABOLIC PANEL - Abnormal; Notable for the following:       Result Value   Glucose, Bld 131 (*)    Calcium  8.7 (*)    All other components within normal limits  URINALYSIS, ROUTINE W REFLEX MICROSCOPIC - Abnormal; Notable for the following:    APPearance CLOUDY (*)    Protein, ur 30 (*)    All other components within normal limits  BRAIN NATRIURETIC PEPTIDE - Abnormal; Notable for the following:    B Natriuretic Peptide 201.8 (*)    All other components within normal limits  URINALYSIS, MICROSCOPIC (REFLEX) - Abnormal; Notable for the following:    Bacteria, UA FEW (*)    Squamous Epithelial / LPF 6-30 (*)    All other components within normal limits  CBC WITH DIFFERENTIAL/PLATELET  TROPONIN I    EKG  EKG Interpretation  Date/Time:  Friday December 02 2016 10:26:50 EST Ventricular Rate:  125 PR Interval:    QRS Duration: 101 QT Interval:  354 QTC Calculation: 511 R Axis:   -55 Text Interpretation:  Atrial fibrillation LAD, consider left anterior fascicular block Low voltage, extremity and precordial leads Consider anterior infarct Borderline repolarization abnormality Prolonged QT interval Baseline wander in lead(s) II Since last tracing rate faster Confirmed by Jasmeet Gehl  MD-J, Blaire Hodsdon ([id number]) on 12/02/2016 10:37:02 AM       Radiology Dg Chest 2 View  Result Date: 12/02/2016 CLINICAL DATA:  Weakness, bilateral leg swelling EXAM: CHEST  2 VIEW COMPARISON:  12/12/1998 third FINDINGS: Patchy severe bilateral airspace disease, right greater than left, most confluent in the right upper lobe. There is cardiomegaly. Small bilateral effusions. No acute bony abnormality. IMPRESSION: Patchy severe bilateral airspace disease, right worse than left. Findings concerning for pneumonia. Cardiomegaly.  Small bilateral effusions. Electronically Signed   By: Charlett NoseKevin  Dover M.D.   On: 12/02/2016 11:05    Procedures Procedures (including critical care time)  Medications Ordered in ED Medications  cefTRIAXone (ROCEPHIN) 1 g in dextrose 5 % 50 mL IVPB (not administered)  azithromycin (ZITHROMAX) tablet 500 mg (not  administered)  ipratropium-albuterol (DUONEB) 0.5-2.5 (3) MG/3ML nebulizer solution 3 mL (not administered)     Initial Impression / Assessment and Plan / ED Course  I have reviewed the triage vital signs and the nursing notes.  Pertinent labs & imaging results that were available during my care of the patient were reviewed by me and considered in my medical decision making (see chart for details).  Clinical Course as of Dec 03 1339  Fri Dec 02, 2016  1339 The patient's chest x-ray is suggestive of bilateral pneumonia right greater than left. The patient's clinical history is somewhat confusing however. She does not have a fever and does not have an elevated white blood cell count. Her BNP however is only mildly elevated. I think pneumonia is a possibility as is ulnar edema associated with congestive heart failure. Plan on transfer for admission to a hospital for further evaluation  [JK]    Clinical Course User Index [JK] Linwood DibblesJon Saafir Abdullah, MD     Final Clinical Impressions(s) / ED Diagnoses   Final diagnoses:  Peripheral edema  Community acquired pneumonia, unspecified laterality      Linwood DibblesJon Camari Quintanilla, MD 12/02/16 1342

## 2016-12-02 NOTE — ED Triage Notes (Signed)
States," I don't feel good" per husband has been feeling sick for 2 days, with nervousness, swelling to lower legs and strong smell to urine. Denies pain

## 2016-12-02 NOTE — Progress Notes (Signed)
[facility] [place] [facility].  Referring physician: Dr Linwood DibblesJon Knapp.   82 y/o female with afib, HTN , dementia with 2-3 days of generalized malaise with weakness. About 2 weeks back she was taken off diuretics by her PCP but noticed increased leg swelling. Her PCP didn't put her back on Lasix about one week back but her leg swellings have not improved much. She reported cough but no fever. No chest pain or abdominal pain.  In the ED vitals were stable. BNP of 202. Labs otherwise unremarkable. No WBC. Chest x-ray with patchy bilateral airspace disease R >L.  [facility] for lobar pneumonia +/- acute CHF.

## 2016-12-02 NOTE — H&P (Signed)
Savannah GumVivian M Hinton RUE:[id number]:[id number] DOB: [birth year] DOA: 12/02/2016     PCP: Kathleene HazelSTOCK, KAITLIN, FNP   Outpatient Specialists: [facility] Patient coming from:   With family    Chief Complaint: Fatigue cough HPI: Savannah Hinton is a 82 y.o. female with medical history significant of afib, HTN , dementia     Presented with 2-3 days of generalized fatigue and malaise have had lower extremity swelling that is chronic 2 weeks ago her primary care provider have switch her over to Lasix family states she have been urinating a lot. Family feels she can tolerate 20 mg. Patient have not been compliant with medications and diet reports salt intake.  Family been puttng on compression stocking seem to work.  Patient has endorsed coughing, some wheezing. No sick contacts. No fevers no chest pain no abdominal pain. Family endorses that her urine has been smelling stronger than usual. Savannah Hinton patient have had difficulty ambulating.    Regarding pertinent Chronic problems: History of early onset dementia followed by neurology. He is taking Aricept and Namenda.  Patient has history of vertigo was July 2017 MRI was done showed no acute abnormality She has known history of atrial fibrillation last echogram was in 2015 reserved EF Left atrium moderately dilated pulmonary pressures 40 mmHg  IN ER:  Temp (24hrs), Avg:98 F (36.7 C), Min:97.5 F (36.4 C), Max:98.4 F (36.9 C)     RR21 HR 110 (a.fib) BP 150/98 BNP 202 WBC 8.4 Hg 12.6 Trop <0.03 Na 140 K 3.7 Chest x-ray bilateral airspace disease right worse than left suspect pneumonia Following Medications were ordered in ER: Medications  ipratropium-albuterol (DUONEB) 0.5-2.5 (3) MG/3ML nebulizer solution 3 mL (3 mLs Nebulization Given 12/02/16 1603)  cefTRIAXone (ROCEPHIN) 1 g in dextrose 5 % 50 mL IVPB (0 g Intravenous Stopped 12/02/16 1613)  azithromycin (ZITHROMAX) tablet 500 mg (500 mg Oral Given 12/02/16 1439)  furosemide (LASIX) injection 20 mg (20  mg Intravenous Given 12/02/16 1416)      Hospitalist was called for admission for Community-acquired pneumonia versus CHF exacerbation   Review of Systems:    Pertinent positives include:  Fatigue, productive cough, vertigo, foul-smelling urine  Constitutional:  No weight loss, night sweats, Fevers, chills, weight loss  HEENT:  No headaches, Difficulty swallowing,Tooth/dental problems,Sore throat,  No sneezing, itching, ear ache, nasal congestion, post nasal drip,  Cardio-vascular:  No chest pain, Orthopnea, PND, anasarca, dizziness, palpitations.no Bilateral lower extremity swelling  GI:  No heartburn, indigestion, abdominal pain, nausea, vomiting, diarrhea, change in bowel habits, loss of appetite, melena, blood in stool, hematemesis Resp:  no shortness of breath at rest. No dyspnea on exertion, No excess mucus, no No non-productive cough, No coughing up of blood.No change in color of mucus.No wheezing. Skin:  no rash or lesions. No jaundice GU:  no dysuria, change in color of urine, no urgency or frequency. No straining to urinate.  No flank pain.  Musculoskeletal:  No joint pain or no joint swelling. No decreased range of motion. No back pain.  Psych:  No change in mood or affect. No depression or anxiety. No memory loss.  Neuro: no localizing neurological complaints, no tingling, no weakness, no double vision, no gait abnormality, no slurred speech, no confusion  As per HPI otherwise 10 point review of systems negative.   Past Medical History: Past Medical History:  Diagnosis Date  . Allergy   . Atrial fibrillation (HCC)   . Dementia   . Glaucoma   . Hyperlipidemia   .  Hypertension   . Memory loss   . Vertigo    Past Surgical History:  Procedure Laterality Date  . APPENDECTOMY  1974  . CHOLECYSTECTOMY  2012  . RETINAL DETACHMENT SURGERY       Social History:  Ambulatory   independently      reports that she has never smoked. She has never used smokeless  tobacco. She reports that she does not drink alcohol or use drugs.  Allergies:   Allergies  Allergen Reactions  . Codeine     hallucinations  . Penicillins Other (See Comments)    Yeast infection        Family History:   Family History  Problem Relation Age of Onset  . Stroke Mother   . Stroke Father   . Cancer Sister     breast  . Heart attack Paternal Uncle     Medications: Prior to Admission medications   Medication Sig Start Date End Date Taking? Authorizing Provider  acetaminophen (TYLENOL) 500 MG tablet Take 500 mg by mouth every 6 (six) hours as needed for mild pain.   Yes Historical Provider, MD  apixaban (ELIQUIS) 5 MG TABS tablet Take 1 tablet (5 mg total) by mouth 2 (two) times daily. 05/17/16  Yes Jake Bathe, MD  Cyanocobalamin (VITAMIN B 12 PO) Take 1 tablet by mouth daily.    Yes Historical Provider, MD  diltiazem (CARTIA XT) 180 MG 24 hr capsule Take 1 capsule (180 mg total) by mouth daily. 05/17/16  Yes Jake Bathe, MD  donepezil (ARICEPT) 23 MG TABS tablet TAKE 1 TABLET (23 MG TOTAL) BY MOUTH DAILY. 11/29/16  Yes Levert Feinstein, MD  furosemide (LASIX) 20 MG tablet Take 40 mg by mouth daily. 11/04/16  Yes Historical Provider, MD  lisinopril (PRINIVIL,ZESTRIL) 20 MG tablet TAKE 1 TABLET (20 MG TOTAL) BY MOUTH 2 (TWO) TIMES DAILY 05/17/16  Yes Jake Bathe, MD  meclizine (ANTIVERT) 25 MG tablet Take 1 tablet (25 mg total) by mouth 3 (three) times daily as needed for dizziness. 04/11/16  Yes Arby Barrette, MD  memantine (NAMENDA) 10 MG tablet Take 1 tablet (10 mg total) by mouth 2 (two) times daily. 11/29/16  Yes Levert Feinstein, MD  timolol (TIMOPTIC) 0.5 % ophthalmic solution Place 1 drop into both eyes 2 (two) times daily.  06/02/11  Yes Historical Provider, MD  NON FORMULARY every 14 (fourteen) days. allergy shots    Historical Provider, MD    Physical Exam: Patient Vitals for the past 24 hrs:  BP Temp Temp src Pulse Resp SpO2 Height Weight  12/02/16 1754 (!) 152/80 97.5  F (36.4 C) Oral 99 20 99 % - -  12/02/16 1605 150/98 - - 96 21 98 % - -  12/02/16 1604 - - - - - 98 % - -  12/02/16 1330 173/69 - - 77 21 99 % - -  12/02/16 1230 158/64 - - 71 17 96 % - -  12/02/16 1200 - - - 66 19 95 % - -  12/02/16 1130 - - - 87 19 97 % - -  12/02/16 1030 (!) 159/144 - - - 24 - - -  12/02/16 1029 - - - - - - 5\' 4"  (1.626 m) 103.4 kg (228 lb)  12/02/16 1027 (!) 167/115 98.4 F (36.9 C) Oral 101 24 97 % - -    1. General:  in No Acute distress 2. Psychological: Alert but not Oriented 3. Head/ENT:   Moist   Mucous  Membranes                          Head Non traumatic, neck supple                           Poor Dentition 4. SKIN: normal   Skin turgor,  Skin clean Dry and intact no rash 5. Heart: Regular rate and rhythm no  Murmur, Rub or gallop 6. Lungs:  no wheezes some  Mild crackles   7. Abdomen: Soft,  non-tender, Non distended 8. Lower extremities: no clubbing, cyanosis, 2+ edema 9. Neurologically Grossly intact, moving all 4 extremities equally  10. MSK: Normal range of motion   body mass index is 39.14 kg/m.  Labs on Admission:   Labs on Admission: I have personally reviewed following labs and imaging studies  CBC:  Recent Labs Lab 12/02/16 1127  WBC 8.4  NEUTROABS 6.3  HGB 12.6  HCT 39.5  MCV 88.2  PLT 260   Basic Metabolic Panel:  Recent Labs Lab 12/02/16 1127  NA 140  K 3.7  CL 105  CO2 28  GLUCOSE 131*  BUN 17  CREATININE 0.89  CALCIUM 8.7*   GFR: Estimated Creatinine Clearance: 60 mL/min (by C-G formula based on SCr of 0.89 mg/dL). Liver Function Tests:  Recent Labs Lab 12/02/16 1127  AST 19  ALT 20  ALKPHOS 89  BILITOT 0.8  PROT 7.2  ALBUMIN 3.5   No results for input(s): LIPASE, AMYLASE in the last 168 hours. No results for input(s): AMMONIA in the last 168 hours. Coagulation Profile: No results for input(s): INR, PROTIME in the last 168 hours. Cardiac Enzymes:  Recent Labs Lab 12/02/16 1127  TROPONINI  <0.03   BNP (last 3 results) No results for input(s): PROBNP in the last 8760 hours. HbA1C: No results for input(s): HGBA1C in the last 72 hours. CBG: No results for input(s): GLUCAP in the last 168 hours. Lipid Profile: No results for input(s): CHOL, HDL, LDLCALC, TRIG, CHOLHDL, LDLDIRECT in the last 72 hours. Thyroid Function Tests: No results for input(s): TSH, T4TOTAL, FREET4, T3FREE, THYROIDAB in the last 72 hours. Anemia Panel: No results for input(s): VITAMINB12, FOLATE, FERRITIN, TIBC, IRON, RETICCTPCT in the last 72 hours. Urine analysis:    Component Value Date/Time   COLORURINE YELLOW 12/02/2016 1235   APPEARANCEUR CLOUDY (A) 12/02/2016 1235   LABSPEC 1.018 12/02/2016 1235   PHURINE 6.5 12/02/2016 1235   GLUCOSEU NEGATIVE 12/02/2016 1235   HGBUR NEGATIVE 12/02/2016 1235   BILIRUBINUR NEGATIVE 12/02/2016 1235   KETONESUR NEGATIVE 12/02/2016 1235   PROTEINUR 30 (A) 12/02/2016 1235   NITRITE NEGATIVE 12/02/2016 1235   LEUKOCYTESUR NEGATIVE 12/02/2016 1235   Sepsis Labs: @LABRCNTIP (procalcitonin:4,lacticidven:4) )No results found for this or any previous visit (from the past 240 hour(s)).     UA  no evidence of UTI     No results found for: HGBA1C  Estimated Creatinine Clearance: 60 mL/min (by C-G formula based on SCr of 0.89 mg/dL).  BNP (last 3 results) No results for input(s): PROBNP in the last 8760 hours.   ECG REPORT  Independently reviewed Rate: 125  Rhythm: a.fib w RVR ST&T Change: No acute ischemic changes    QTC 5122  Filed Weights   12/02/16 1029  Weight: 103.4 kg (228 lb)     Cultures: No results found for: SDES, SPECREQUEST, CULT, REPTSTATUS   Radiological Exams on Admission: Dg Chest 2  View  Result Date: 12/02/2016 CLINICAL DATA:  Weakness, bilateral leg swelling EXAM: CHEST  2 VIEW COMPARISON:  12/12/1998 third FINDINGS: Patchy severe bilateral airspace disease, right greater than left, most confluent in the right upper lobe. There is  cardiomegaly. Small bilateral effusions. No acute bony abnormality. IMPRESSION: Patchy severe bilateral airspace disease, right worse than left. Findings concerning for pneumonia. Cardiomegaly.  Small bilateral effusions. Electronically Signed   By: Charlett Nose M.D.   On: 12/02/2016 11:05    Chart has been reviewed    Assessment/Plan  82 y.o. female with medical history significant of afib, HTN , dementia admitted for Community-acquired pneumonia versus CHF exacerbation        Present on Admission:  CHF exacerbation - - admit on telemetry, cycle cardiac enzymes, obtain serial ECG, to evaluate for ischemia as a cause of heart failure  monitor daily weight  diurese with IV lasix and monitor orthostatics and creatinine to avoid over diuresis.  Order echogram to evaluate EF and valves Make sure patient is on ACE/ARBi  Consider cardiology consult if significant chagnes on ECG  . Cardiomegaly - obtain echo . CAP (community acquired pneumonia) -  - will admit for treatment of CAP will start on appropriate antibiotic coverage.   Obtain sputum cultures, blood cultures if febrile or if decompensates.  Provide oxygen as needed.   .Chronic Atrial fibrillation (HCC)        - CHA2DS2 vas score  4 : continue current anticoagulation with Eliquis           -  Rate control:  Currently controlled with  Diltiazem will continue    . 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 Essential hypertension continue home medications   Other plan as per orders.  DVT prophylaxis:  eliquis Code Status:  FULL CODE   as per patient    Family Communication:   Family   at  Bedside  plan of care was discussed with  Husband,   Disposition Plan:   To home once workup is complete and patient is stable                      Would benefit from PT/OT eval prior to DC  ordered                                              Consults called: None   Admission status:    inpatient     Level of care     tele         I have spent a total of 56 min on this admission    Carmelle Bamberg 12/02/2016, 10:07 PM    [facility]  Pager [phone]   after 2 AM please page floor coverage PA If 7AM-7PM, please contact the day team taking care of the patient  [email or web address]  Password [email or web address]

## 2016-12-03 ENCOUNTER — Inpatient Hospital Stay (HOSPITAL_COMMUNITY): Payer: Medicare Other

## 2016-12-03 DIAGNOSIS — I517 Cardiomegaly: Secondary | ICD-10-CM

## 2016-12-03 DIAGNOSIS — J810 Acute pulmonary edema: Secondary | ICD-10-CM

## 2016-12-03 LAB — COMPREHENSIVE METABOLIC PANEL
ALT: 17 U/L (ref 14–54)
ANION GAP: 6 (ref 5–15)
AST: 16 U/L (ref 15–41)
Albumin: 3.1 g/dL — ABNORMAL LOW (ref 3.5–5.0)
Alkaline Phosphatase: 71 U/L (ref 38–126)
BUN: 23 mg/dL — ABNORMAL HIGH (ref 6–20)
CHLORIDE: 107 mmol/L (ref 101–111)
CO2: 28 mmol/L (ref 22–32)
CREATININE: 1.13 mg/dL — AB (ref 0.44–1.00)
Calcium: 8.5 mg/dL — ABNORMAL LOW (ref 8.9–10.3)
GFR, EST AFRICAN AMERICAN: 52 mL/min — AB (ref >60)
GFR, EST NON AFRICAN AMERICAN: 45 mL/min — AB (ref >60)
Glucose, Bld: 123 mg/dL — ABNORMAL HIGH (ref 65–99)
POTASSIUM: 3.2 mmol/L — AB (ref 3.5–5.1)
SODIUM: 141 mmol/L (ref 135–145)
Total Bilirubin: 0.8 mg/dL (ref 0.3–1.2)
Total Protein: 6.7 g/dL (ref 6.5–8.1)

## 2016-12-03 LAB — ECHOCARDIOGRAM COMPLETE
Ao-asc: 33 cm
CHL CUP DOP CALC LVOT VTI: 24 cm
CHL CUP MV DEC (S): 141
CHL CUP STROKE VOLUME: 37 mL
CHL CUP TV REG PEAK VELOCITY: 325 cm/s
E decel time: 141 msec
FS: 31 % (ref 28–44)
Height: 64 in
IV/PV OW: 0.92
LA ID, A-P, ES: 45 mm
LA vol A4C: 99.4 ml
LA vol: 111 mL
LADIAMINDEX: 2.2 cm/m2
LAVOLIN: 54.1 mL/m2
LDCA: 2.84 cm2
LEFT ATRIUM END SYS DIAM: 45 mm
LV PW d: 10.2 mm — AB (ref 0.6–1.1)
LV SIMPSON'S DISK: 71
LV dias vol index: 26 mL/m2
LV dias vol: 53 mL (ref 46–106)
LV sys vol index: 8 mL/m2
LVOT diameter: 19 mm
LVOTPV: 103 cm/s
LVOTSV: 68 mL
LVSYSVOL: 16 mL (ref 14–42)
MVPG: 8 mmHg
MVPKEVEL: 142 m/s
RV sys press: 45 mmHg
TAPSE: 17.4 mm
TR max vel: 325 cm/s
Weight: 3576 oz

## 2016-12-03 LAB — CBC
HEMATOCRIT: 36 % (ref 36.0–46.0)
HEMOGLOBIN: 11.6 g/dL — AB (ref 12.0–15.0)
MCH: 28 pg (ref 26.0–34.0)
MCHC: 32.2 g/dL (ref 30.0–36.0)
MCV: 86.7 fL (ref 78.0–100.0)
PLATELETS: 258 10*3/uL (ref 150–400)
RBC: 4.15 MIL/uL (ref 3.87–5.11)
RDW: 15.4 % (ref 11.5–15.5)
WBC: 7.8 10*3/uL (ref 4.0–10.5)

## 2016-12-03 LAB — MAGNESIUM: MAGNESIUM: 2 mg/dL (ref 1.7–2.4)

## 2016-12-03 LAB — PHOSPHORUS: PHOSPHORUS: 3.6 mg/dL (ref 2.5–4.6)

## 2016-12-03 LAB — TSH: TSH: 2.916 u[IU]/mL (ref 0.350–4.500)

## 2016-12-03 LAB — TROPONIN I

## 2016-12-03 MED ORDER — POTASSIUM CHLORIDE CRYS ER 20 MEQ PO TBCR
40.0000 meq | EXTENDED_RELEASE_TABLET | ORAL | Status: AC
  Administered 2016-12-03 (×2): 40 meq via ORAL
  Filled 2016-12-03 (×2): qty 2

## 2016-12-03 MED ORDER — FUROSEMIDE 10 MG/ML IJ SOLN
40.0000 mg | Freq: Two times a day (BID) | INTRAMUSCULAR | Status: DC
  Administered 2016-12-03 – 2016-12-05 (×5): 40 mg via INTRAVENOUS
  Filled 2016-12-03 (×5): qty 4

## 2016-12-03 NOTE — Progress Notes (Signed)
PT Cancellation Note  Patient Details Name: Savannah Hinton MRN: [id number] DOB: 05-25-1935   Cancelled Treatment:    Reason Eval/Treat Not Completed: PT screened, no needs identified, will sign off (per RN no needs)  Drucilla Chaletara Cooper Stamp, PT Pager: [phone] 12/03/2016  Drucilla ChaletWILLIAMS,Savannah Hinton 12/03/2016, 1:10 PM

## 2016-12-03 NOTE — Progress Notes (Signed)
SATURATION QUALIFICATIONS: (This note is used to comply with regulatory documentation for home oxygen)  Patient Saturations on Room Air at Rest = 97%  Patient Saturations on Room Air while Ambulating = 90%  

## 2016-12-03 NOTE — Progress Notes (Signed)
12/03/16  1559  Paged MD to notify that patient has had three liquid stools today.

## 2016-12-03 NOTE — Progress Notes (Signed)
*  PRELIMINARY RESULTS* Echocardiogram 2D Echocardiogram has been performed.  Stacey DrainWhite, Myriah Boggus J 12/03/2016, 12:18 PM

## 2016-12-03 NOTE — Progress Notes (Addendum)
PROGRESS NOTE    Savannah Hinton   RUE:[id number]  DOB: [birth year]  DOA: 12/02/2016 PCP: Kathleene HazelSTOCK, KAITLIN, FNP   Brief Narrative:  Savannah GumVivian M Hinton is a 82 y.o. female with medical history significant of A-fib follows with Dr Anne FuSkains, pedal edema, HTN , dementia who presented with 2-3 days of generalized fatigue and malaise. She has had lower extremity swelling that is chronic. About 2 weeks back she was taken off diuretics by her PCP but noticed increased leg swelling and resumed Lasix.  The husband was worried about her urinating a great deal and thinks it is making her weak.  She has been wearing compression stockings as well. No significant cough or fevers noted.   Subjective: Mostly confused. History unreliable. Has no current complaints. Husband at bedside mostly concerned about her weakness. States leg swelling is improving.    Assessment & Plan:   Active Problems: Weakness - when ambulating with me, she was able to walk > 250 ft without any trouble.  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 - follow for behavioral disturbance    Vertigo - no symptoms at this time, d/c Meclizine for now  DVT prophylaxis: Eliquis Code Status: Full Family Communication: husband Disposition Plan: home when stable Consultants:   none Procedures:   ECHO- report pending Antimicrobials:  Anti-infectives    Start     Dose/Rate Route Frequency Ordered Stop   12/03/16  1400  azithromycin (ZITHROMAX) tablet 500 mg     500 mg Oral Daily 12/02/16 1909     12/03/16 1400  cefTRIAXone (ROCEPHIN) 1 g in dextrose 5 % 50 mL IVPB     1 g 100 mL/hr over 30 Minutes Intravenous Every 24 hours 12/02/16 1909     12/02/16 1330  cefTRIAXone (ROCEPHIN) 1 g in dextrose 5 % 50 mL IVPB     1 g 100 mL/hr over 30 Minutes Intravenous  Once 12/02/16 1326 12/02/16 1613   12/02/16 1330  azithromycin (ZITHROMAX) tablet 500 mg     500 mg Oral  Once 12/02/16 1326 12/02/16 1439       Objective: Vitals:   12/02/16 2112 12/02/16 2136 12/03/16 0450 12/03/16 0907  BP: 140/80  121/76 (!) 168/82  Pulse: 82  92 (!) 123  Resp: 18  18 20   Temp: 98.8 F (37.1 C)  99.1 F (37.3 C)   TempSrc: Oral  Oral   SpO2: 93% 99% 93% 95%  Weight:    101.4 kg (223 lb 8 oz)  Height:        Intake/Output Summary (Last 24 hours) at 12/03/16 1211 Last data filed at 12/03/16 1041  Gross per 24 hour  Intake              240 ml  Output              301 ml  Net              -  61 ml   Filed Weights   12/02/16 1029 12/03/16 0907  Weight: 103.4 kg (228 lb) 101.4 kg (223 lb 8 oz)    Examination: General exam: Appears comfortable  HEENT: PERRLA, oral mucosa moist, no sclera icterus or thrush Respiratory system: Clear to auscultation. Respiratory effort normal. Cardiovascular system: S1 & S2 heard, RRR.  No murmurs  Gastrointestinal system: Abdomen soft, non-tender, nondistended. Normal bowel sound. No organomegaly Central nervous system: Alert and oriented to person. No focal neurological deficits. Extremities: No cyanosis, clubbing - + 2 pitting edema Skin: No rashes or ulcers- thickening of skin of legs consistent with chronic edema Psychiatry:  Mood & affect appropriate but quite confused.     Data Reviewed: I have personally reviewed following labs and imaging studies  CBC:  Recent Labs Lab 12/02/16 1127 12/03/16 0115  WBC 8.4 7.8  NEUTROABS 6.3  --   HGB 12.6 11.6*  HCT 39.5 36.0   MCV 88.2 86.7  PLT 260 258   Basic Metabolic Panel:  Recent Labs Lab 12/02/16 1127 12/03/16 0115  NA 140 141  K 3.7 3.2*  CL 105 107  CO2 28 28  GLUCOSE 131* 123*  BUN 17 23*  CREATININE 0.89 1.13*  CALCIUM 8.7* 8.5*  MG  --  2.0  PHOS  --  3.6   GFR: Estimated Creatinine Clearance: 46.8 mL/min (by C-G formula based on SCr of 1.13 mg/dL (H)). Liver Function Tests:  Recent Labs Lab 12/02/16 1127 12/03/16 0115  AST 19 16  ALT 20 17  ALKPHOS 89 71  BILITOT 0.8 0.8  PROT 7.2 6.7  ALBUMIN 3.5 3.1*   No results for input(s): LIPASE, AMYLASE in the last 168 hours. No results for input(s): AMMONIA in the last 168 hours. Coagulation Profile: No results for input(s): INR, PROTIME in the last 168 hours. Cardiac Enzymes:  Recent Labs Lab 12/02/16 1127 12/02/16 2011 12/03/16 0115 12/03/16 0653  TROPONINI <0.03 <0.03 <0.03 <0.03   BNP (last 3 results) No results for input(s): PROBNP in the last 8760 hours. HbA1C: No results for input(s): HGBA1C in the last 72 hours. CBG: No results for input(s): GLUCAP in the last 168 hours. Lipid Profile: No results for input(s): CHOL, HDL, LDLCALC, TRIG, CHOLHDL, LDLDIRECT in the last 72 hours. Thyroid Function Tests:  Recent Labs  12/03/16 0115  TSH 2.916   Anemia Panel: No results for input(s): VITAMINB12, FOLATE, FERRITIN, TIBC, IRON, RETICCTPCT in the last 72 hours. Urine analysis:    Component Value Date/Time   COLORURINE YELLOW 12/02/2016 1235   APPEARANCEUR CLOUDY (A) 12/02/2016 1235   LABSPEC 1.018 12/02/2016 1235   PHURINE 6.5 12/02/2016 1235   GLUCOSEU NEGATIVE 12/02/2016 1235   HGBUR NEGATIVE 12/02/2016 1235   BILIRUBINUR NEGATIVE 12/02/2016 1235   KETONESUR NEGATIVE 12/02/2016 1235   PROTEINUR 30 (A) 12/02/2016 1235   NITRITE NEGATIVE 12/02/2016 1235   LEUKOCYTESUR NEGATIVE 12/02/2016 1235   Sepsis Labs: @LABRCNTIP (procalcitonin:4,lacticidven:4) )No results found for this or any previous visit (from  the past 240 hour(s)).       Radiology Studies: Dg Chest 2 View  Result Date: 12/02/2016 CLINICAL DATA:  Weakness, bilateral leg swelling EXAM: CHEST  2 VIEW COMPARISON:  12/12/1998 third FINDINGS: Patchy severe bilateral airspace disease, right greater than left, most confluent in the right upper lobe. There is cardiomegaly. Small bilateral effusions. No acute bony abnormality. IMPRESSION: Patchy severe bilateral airspace disease, right worse than left. Findings concerning for pneumonia. Cardiomegaly.  Small bilateral effusions. Electronically Signed   By:  Charlett Nose M.D.   On: 12/02/2016 11:05      Scheduled Meds: . apixaban  5 mg Oral BID  . azithromycin  500 mg Oral Daily  . cefTRIAXone (ROCEPHIN)  IV  1 g Intravenous Q24H  . diltiazem  180 mg Oral QHS  . donepezil  23 mg Oral QHS  . furosemide  40 mg Intravenous Q12H  . guaiFENesin  600 mg Oral BID  . lisinopril  20 mg Oral Daily  . memantine  10 mg Oral BID  . potassium chloride  40 mEq Oral Q4H  . sodium chloride flush  3 mL Intravenous Q12H  . sodium chloride flush  3 mL Intravenous Q12H  . timolol  1 drop Both Eyes BID   Continuous Infusions:   LOS: 1 day    Time spent in minutes: 35    Keilly Fatula, MD [facility] Pager: [email or web address] Password [email or web address] 12/03/2016, 12:11 PM

## 2016-12-04 DIAGNOSIS — R42 Dizziness and giddiness: Secondary | ICD-10-CM

## 2016-12-04 DIAGNOSIS — I5031 Acute diastolic (congestive) heart failure: Secondary | ICD-10-CM

## 2016-12-04 LAB — BASIC METABOLIC PANEL
ANION GAP: 10 (ref 5–15)
BUN: 23 mg/dL — AB (ref 6–20)
CALCIUM: 9 mg/dL (ref 8.9–10.3)
CO2: 29 mmol/L (ref 22–32)
Chloride: 104 mmol/L (ref 101–111)
Creatinine, Ser: 1 mg/dL (ref 0.44–1.00)
GFR calc Af Amer: >60 mL/min (ref >60)
GFR, EST NON AFRICAN AMERICAN: 52 mL/min — AB (ref >60)
Glucose, Bld: 126 mg/dL — ABNORMAL HIGH (ref 65–99)
POTASSIUM: 3.8 mmol/L (ref 3.5–5.1)
SODIUM: 143 mmol/L (ref 135–145)

## 2016-12-04 LAB — MAGNESIUM: MAGNESIUM: 1.9 mg/dL (ref 1.7–2.4)

## 2016-12-04 MED ORDER — POTASSIUM CHLORIDE CRYS ER 20 MEQ PO TBCR
40.0000 meq | EXTENDED_RELEASE_TABLET | Freq: Once | ORAL | Status: AC
  Administered 2016-12-04: 40 meq via ORAL
  Filled 2016-12-04: qty 2

## 2016-12-04 MED ORDER — CARVEDILOL 6.25 MG PO TABS
6.2500 mg | ORAL_TABLET | Freq: Two times a day (BID) | ORAL | Status: DC
Start: 2016-12-04 — End: 2016-12-05
  Administered 2016-12-04 (×2): 6.25 mg via ORAL
  Filled 2016-12-04 (×2): qty 1

## 2016-12-04 NOTE — Progress Notes (Signed)
Pt ambulated in the hall with husband and tech, tolerated well.

## 2016-12-04 NOTE — Progress Notes (Signed)
PROGRESS NOTE    Savannah GumVivian M Aamodt   WGN:[id number]  DOB: Nov 13, 1934  DOA: 12/02/2016 PCP: Kathleene HazelSTOCK, KAITLIN, FNP   Brief Narrative:  Savannah Hinton is a 82 y.o. female with medical history significant of A-fib follows with Dr Anne FuSkains, pedal edema, HTN , dementia who presented with 2-3 days of generalized fatigue and malaise. She has had lower extremity swelling that is chronic. About 2 weeks back she was taken off diuretics by her PCP but noticed increased leg swelling and resumed Lasix.  The husband was worried about her urinating a great deal and thinks it is making her weak.  She has been wearing compression stockings as well. No significant cough or fevers noted. She always sleeps sitting up in a recliner therefore cannot determined if she has orthopnea  Subjective: Confused and at baseline. No immediate complaints.   Assessment & Plan:   Active Problems: Weakness - when ambulating with me, she was able to walk > 250 ft without any trouble.  - 3/11-  ambulated her again with the tech- walked > 300 ft today with just holding her left hand - husband states she barely walks at home- due to her dementia, I have told him that he may have to take her for walks around the house like I have - PT eval  Acute CHF - ECHO done- EF normal-  was not able to determine dCHF but she has severe atrial enlargement which would suggest dCHF. RV dilated as well with increased central venous pressure - pulse ox was about 95 when ambulating with me - cont Lasix at 40 BID - doubt pneumonia as she has no cough- d/c Rocephin and Zithromax - discussed dietary changes with low sodium diet    Essential hypertension - on Cardizem, Lasix, Lisinopril - hold Lisinopril as Cr was rising from Lasix - started Coreg to help control BP further today - SBP has been in [id number] range since admisoin- she will need it going home- good control of BP will prevent further dCHF exacerbations    A-fib   Chronic  anticoagulation - cont Cardizem and Eliquis    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 Wall thickness was   normal. Systolic function was vigorous. The estimated ejection   fraction was in the range of 65% to 70%. Wall motion was normal;   there were no regional wall motion abnormalities. The study is   not technically sufficient to allow evaluation of LV diastolic   function. - Mitral valve: Mildly thickened leaflets . There was mild   regurgitation. - Left atrium: Severely dilated. - Right ventricle: The cavity size was mildly dilated. - Right atrium: Moderately dilated. - Tricuspid valve: There was mild regurgitation. - Pulmonary arteries: PA peak pressure: 45 mm Hg (S). - Inferior vena cava: The vessel was dilated. The respirophasic   diameter changes were blunted (< 50%), consistent with elevated   central venous pressure.  Impressions:  - Compared to a study in 2015, the LVEF is unchanged. There is now   severe LAE and moderate RAE. RVSP is higher at 45 mmHg.  Antimicrobials:  Anti-infectives    Start     Dose/Rate Route Frequency Ordered Stop   12/03/16 1400  azithromycin (ZITHROMAX) tablet 500 mg  Status:  Discontinued     500 mg Oral Daily 12/02/16 1909 12/03/16 1256   12/03/16 1400  cefTRIAXone (ROCEPHIN) 1 g in dextrose 5 % 50 mL IVPB  Status:  Discontinued     1 g 100 mL/hr over 30 Minutes Intravenous Every 24 hours 12/02/16 1909 12/03/16 1256   12/02/16 1330  cefTRIAXone (ROCEPHIN) 1 g in dextrose 5 % 50 mL IVPB     1 g 100 mL/hr over 30 Minutes  Intravenous  Once 12/02/16 1326 12/02/16 1613   12/02/16 1330  azithromycin (ZITHROMAX) tablet 500 mg     500 mg Oral  Once 12/02/16 1326 12/02/16 1439       Objective: Vitals:   12/04/16 0501 12/04/16 0700 12/04/16 0835 12/04/16 0836  BP: (!) 176/113 (!) 160/90 (!) 161/102 (!) 161/102  Pulse: (!) 102  95 95  Resp: 18 18    Temp: 98.2 F (36.8 C) 98.6 F (37 C)    TempSrc: Oral Oral    SpO2: 92% 93%    Weight:      Height:        Intake/Output Summary (Last 24 hours) at 12/04/16 1433 Last data filed at 12/04/16 1100  Gross per 24 hour  Intake              346 ml  Output             1601 ml  Net            -1255 ml   Filed Weights   12/02/16 1029 12/03/16 0907 12/04/16 0500  Weight: 103.4 kg (228 lb) 101.4 kg (223 lb 8 oz) 102 kg (224 lb 13.9 oz)    Examination: General exam: Appears comfortable  HEENT: PERRLA, oral mucosa moist, no sclera icterus or thrush Respiratory system: Clear to auscultation. Respiratory effort normal. Cardiovascular system: S1 & S2 heard, RRR.  No murmurs  Gastrointestinal system: Abdomen soft, non-tender, nondistended. Normal bowel sound. No organomegaly Central nervous system: Alert and oriented to person. No focal neurological deficits. Extremities: No cyanosis, clubbing - + 1 pitting edema Skin: No rashes or ulcers- thickening of skin of legs consistent with chronic edema Psychiatry:  Mood & affect appropriate but quite confused.     Data Reviewed: I have personally reviewed following labs and imaging studies  CBC:  Recent Labs Lab 12/02/16 1127 12/03/16 0115  WBC 8.4 7.8  NEUTROABS 6.3  --   HGB 12.6 11.6*  HCT 39.5 36.0  MCV 88.2 86.7  PLT 260 258   Basic Metabolic Panel:  Recent Labs Lab 12/02/16 1127 12/03/16 0115 12/04/16 0547  NA 140 141 143  K 3.7 3.2* 3.8  CL 105 107 104  CO2 28 28 29   GLUCOSE 131* 123* 126*  BUN 17 23* 23*  CREATININE 0.89 1.13* 1.00  CALCIUM 8.7* 8.5* 9.0  MG  --  2.0 1.9  PHOS  --   3.6  --    GFR: Estimated Creatinine Clearance: 53 mL/min (by C-G formula based on SCr of 1 mg/dL). Liver Function Tests:  Recent Labs Lab 12/02/16 1127 12/03/16 0115  AST 19 16  ALT 20 17  ALKPHOS 89 71  BILITOT 0.8 0.8  PROT 7.2 6.7  ALBUMIN 3.5 3.1*   No results for input(s): LIPASE, AMYLASE in the last 168 hours. No results for  input(s): AMMONIA in the last 168 hours. Coagulation Profile: No results for input(s): INR, PROTIME in the last 168 hours. Cardiac Enzymes:  Recent Labs Lab 12/02/16 1127 12/02/16 2011 12/03/16 0115 12/03/16 0653  TROPONINI <0.03 <0.03 <0.03 <0.03   BNP (last 3 results) No results for input(s): PROBNP in the last 8760 hours. HbA1C: No results for input(s): HGBA1C in the last 72 hours. CBG: No results for input(s): GLUCAP in the last 168 hours. Lipid Profile: No results for input(s): CHOL, HDL, LDLCALC, TRIG, CHOLHDL, LDLDIRECT in the last 72 hours. Thyroid Function Tests:  Recent Labs  12/03/16 0115  TSH 2.916   Anemia Panel: No results for input(s): VITAMINB12, FOLATE, FERRITIN, TIBC, IRON, RETICCTPCT in the last 72 hours. Urine analysis:    Component Value Date/Time   COLORURINE YELLOW 12/02/2016 1235   APPEARANCEUR CLOUDY (A) 12/02/2016 1235   LABSPEC 1.018 12/02/2016 1235   PHURINE 6.5 12/02/2016 1235   GLUCOSEU NEGATIVE 12/02/2016 1235   HGBUR NEGATIVE 12/02/2016 1235   BILIRUBINUR NEGATIVE 12/02/2016 1235   KETONESUR NEGATIVE 12/02/2016 1235   PROTEINUR 30 (A) 12/02/2016 1235   NITRITE NEGATIVE 12/02/2016 1235   LEUKOCYTESUR NEGATIVE 12/02/2016 1235   Sepsis Labs: @LABRCNTIP (procalcitonin:4,lacticidven:4) )No results found for this or any previous visit (from the past 240 hour(s)).       Radiology Studies: Dg Chest Port 1 View  Result Date: 12/03/2016 CLINICAL DATA:  Acute CHF, weakness EXAM: PORTABLE CHEST 1 VIEW COMPARISON:  12/02/2016 FINDINGS: Bilateral upper lobe/ perihilar opacities, improved. Mild right  basilar opacity. Left lung base is obscured, favoring a small left pleural effusion. Cardiomegaly. IMPRESSION: Improving upper lobe/ perihilar opacities, favoring mild interstitial edema, less likely multifocal infection. Associated cardiomegaly. Suspected small left pleural effusion. Electronically Signed   By: Charline Bills M.D.   On: 12/03/2016 14:47      Scheduled Meds: . apixaban  5 mg Oral BID  . carvedilol  6.25 mg Oral BID WC  . diltiazem  180 mg Oral QHS  . donepezil  23 mg Oral QHS  . furosemide  40 mg Intravenous Q12H  . guaiFENesin  600 mg Oral BID  . memantine  10 mg Oral BID  . sodium chloride flush  3 mL Intravenous Q12H  . sodium chloride flush  3 mL Intravenous Q12H  . timolol  1 drop Both Eyes BID   Continuous Infusions:   LOS: 2 days    Time spent in minutes: 35    Sehar Sedano, MD [facility] Pager: [email or web address] Password [facility] 12/04/2016, 2:33 PM

## 2016-12-04 NOTE — Progress Notes (Signed)
OT Cancellation Note  Patient Details Name: Eliseo GumVivian M Mcfetridge MRN: [id number] DOB: [birth year]   Cancelled Treatment:    Reason Eval/Treat Not Completed: OT screened, no needs identified, will sign off. Stopped by and talked to pt's husband. He feels that they will be fine at home:  He does have a walk in shower and 3:1 commode at home.  Pt has been up walking with nursing and using toilet.  Jerauld Bostwick 12/04/2016, 11:39 AM  Marica OtterMaryellen Shya Kovatch, OTR/L [phone] 12/04/2016

## 2016-12-04 NOTE — Progress Notes (Signed)
Nutrition Brief Note  Patient identified on the Malnutrition Screening Tool (MST) Report  Pt with recent weight gain. Pt eating 100% of breakfast this morning of banana, eggs, toast, potatoes, and milk. Pt with one day of poor appetite (admission day).   Wt Readings from Last 15 Encounters:  12/04/16 224 lb 13.9 oz (102 kg)  11/29/16 228 lb (103.4 kg)  05/31/16 213 lb (96.6 kg)  05/17/16 212 lb 1.9 oz (96.2 kg)  04/11/16 207 lb (93.9 kg)  11/24/15 207 lb 6.4 oz (94.1 kg)  05/26/15 196 lb 12.8 oz (89.3 kg)  11/05/14 205 lb (93 kg)  11/03/14 205 lb (93 kg)  05/07/14 202 lb (91.6 kg)  04/30/14 202 lb (91.6 kg)  03/24/14 201 lb 12.8 oz (91.5 kg)  03/10/14 204 lb (92.5 kg)  02/24/14 205 lb (93 kg)  10/14/13 201 lb (91.2 kg)    Body mass index is 38.6 kg/m. Patient meets criteria for obesity based on current BMI.   Current diet order is Heart Healthy, patient is consuming approximately 75-100% of meals at this time. Labs and medications reviewed.   No nutrition interventions warranted at this time. If nutrition issues arise, please consult RD.   Tilda FrancoLindsey Bethany Cumming, MS, RD, LDN Pager: [phone] After Hours Pager: [phone]

## 2016-12-05 DIAGNOSIS — I5031 Acute diastolic (congestive) heart failure: Secondary | ICD-10-CM

## 2016-12-05 DIAGNOSIS — G309 Alzheimer's disease, unspecified: Secondary | ICD-10-CM

## 2016-12-05 DIAGNOSIS — F0280 Dementia in other diseases classified elsewhere without behavioral disturbance: Secondary | ICD-10-CM

## 2016-12-05 LAB — BASIC METABOLIC PANEL
ANION GAP: 6 (ref 5–15)
BUN: 25 mg/dL — AB (ref 6–20)
CALCIUM: 8.5 mg/dL — AB (ref 8.9–10.3)
CO2: 32 mmol/L (ref 22–32)
Chloride: 105 mmol/L (ref 101–111)
Creatinine, Ser: 1.02 mg/dL — ABNORMAL HIGH (ref 0.44–1.00)
GFR calc Af Amer: 59 mL/min — ABNORMAL LOW (ref >60)
GFR, EST NON AFRICAN AMERICAN: 51 mL/min — AB (ref >60)
GLUCOSE: 97 mg/dL (ref 65–99)
Potassium: 3.9 mmol/L (ref 3.5–5.1)
SODIUM: 143 mmol/L (ref 135–145)

## 2016-12-05 LAB — MAGNESIUM: MAGNESIUM: 2.1 mg/dL (ref 1.7–2.4)

## 2016-12-05 MED ORDER — FUROSEMIDE 20 MG PO TABS: 20.0000 mg | ORAL_TABLET | Freq: Every day | ORAL | 3 refills | Status: DC

## 2016-12-05 MED ORDER — ACETAMINOPHEN 325 MG PO TABS: 650.0000 mg | ORAL_TABLET | Freq: Four times a day (QID) | ORAL | Status: DC | PRN

## 2016-12-05 MED ORDER — CARVEDILOL 25 MG PO TABS: 25.0000 mg | ORAL_TABLET | Freq: Two times a day (BID) | ORAL | 0 refills | Status: DC

## 2016-12-05 MED ORDER — CARVEDILOL 25 MG PO TABS
25.0000 mg | ORAL_TABLET | Freq: Two times a day (BID) | ORAL | Status: DC
  Administered 2016-12-05: 25 mg via ORAL
  Filled 2016-12-05: qty 1

## 2016-12-05 MED ORDER — POTASSIUM CHLORIDE ER 10 MEQ PO TBCR: 10.0000 meq | EXTENDED_RELEASE_TABLET | Freq: Every day | ORAL | 0 refills | Status: DC

## 2016-12-05 NOTE — Progress Notes (Signed)
Husband is concerned about discharged. States wife is doing better but she is still weak and he is afraid that she will go home and decline further and he wonders if he will be able to adequately care for her. Does have needed equipment but reports she has declined over last year and is nervous about continued decline and how he will continue to care for her. Asked about home care services/aid services, etc. RN discussed that home health is usually short term and for skilled services. Discussed that there are private aid services and in home sitter type services that are usually private pay. Husband states they have BCBS but not sure if any benefits for aid services. RN advised to contact insurance to inquire about this. RN also advised that she will pass to day shift nurse to further discuss with CM if he feels like he needs more information. Husband also discussed that the doctor mentioned changing diet because she needs lower sodium diet. States they frequently eat out at fast food because its easier. RN discussed healthy options and lower sodium options. Husband states he will attempt to do better with diet. Will continue to monitor.

## 2016-12-05 NOTE — Discharge Summary (Addendum)
Physician Discharge Summary  Savannah Hinton ZOX:[id number] DOB: [birth year] DOA: 12/02/2016  PCP: Kathleene Hazel, FNP  Admit date: 12/02/2016 Discharge date: 12/05/2016  Admitted From: home  Disposition:  home   Recommendations for Outpatient Follow-up:  1. Bmet in 4-5 days at PCPs office 2. Follow daily weights and adjust dose of lasix as needed- weight today is 211 lb 3. must reinforce teaching with husband at follow up visit- quite anxious about caring for her at home 4. Urinary incontinence is severe- wears incontinence pads which husband has to change-  have asked husband to take her to the commode at least 3 times a day and keep as dry as possible 5. 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 She has had lower extremity swelling that is chronic. About 2 weeks back she was taken off diuretics by her PCP but noticed increased leg swelling and resumed Lasix.  The husband was worried about her urinating a great deal and thinks it is making her weak.  She has been wearing compression stockings as well.  She always sleeps sitting up in a recliner therefore cannot determined if she has  orthopnea  Subjective: Confused and at baseline. No immediate complaints.   Assessment & Plan:   Active Problems: Weakness - 3/10 when ambulating with me, she was able to walk > 250 ft without any trouble.  - 3/11-  I ambulated her again with the tech- walked > 300 ft without a walker- I held her left hand to guide her - husband states she barely walks at home- explained to the husband that she is not weak-  due to her dementia, I have told him that he may have to take her for walks around the house as we have- she does not need any assistive device    Acute dCHF - doubt pneumonia as she has no cough- d/c Rocephin and Zithromax - ECHO done- EF normal-  was not able to determine dCHF but she has severe atrial enlargement which would suggest dCHF. RV dilated as well with increased central venous pressure - pulse ox was about 95 when ambulating with me - continued on IV Lasix at 40 BID-  - weight has dropped from 228 lb to 211 lb - pulse ox > 95 at rest and with walking - pedal edema resolved -Cr 0.8 >> 1.02- stop IV Diuretics- resume Lasix at 20 mg daily  - weigh daily at home and f/u with PCP in 4-5 days - must reinforce teaching with husband a follow up visit- quite anxious about caring for her at home - discussed dietary changes with low sodium diet - he states they eat out  most of the time- nutrition consulted as well   Urinary incontinence - discussed frequent changing of pads at home and at least 3 trips to the bathroom during the day - high risk for UTIs    Essential hypertension - on Cardizem, Lasix, Lisinopril at home - hold Lisinopril as Cr was rising from Lasix - started Coreg yesterday to help control BP further   - titrate BP meds to keep SBP at goal to prevent further fluid retention - will resume Lisinopril today    A-fib   Chronic anticoagulation - cont Cardizem and Eliquis- HR has been in 80-90s A-fib- Coreg added as mentioned    Dementia in Alzheimer's  disease with early onset - no behavioral disturbance- confusion is severe but she is pleasant    Vertigo - no symptoms at this time, d/c Meclizine for now   Procedures:  ECHO- Left ventricle: The cavity size was normal. Wall thickness was normal. Systolic function was vigorous. The estimated ejection fraction was in the range of 65% to 70%. Wall motion was normal; there were no regional wall motion abnormalities. The study is not technically sufficient to allow evaluation of LV diastolic function. - Mitral valve: Mildly thickened leaflets . There was mild regurgitation. - Left atrium: Severely dilated. - Right ventricle: The cavity size was mildly dilated. - Right atrium: Moderately dilated. - Tricuspid valve: There was mild regurgitation. - Pulmonary arteries: PA peak pressure: 45 mm Hg (S). - Inferior vena cava: The vessel was dilated. The respirophasic diameter changes were blunted (<50%), consistent with elevated central venous pressure.  Impressions:  - Compared to a study in 2015, the LVEF is unchanged. There is now severe LAE and moderate RAE. RVSP is higher at 45 mmHg.    Discharge Instructions  Discharge Instructions    (HEART FAILURE PATIENTS) Call MD:  Anytime you have any of the following symptoms: 1) 3 pound weight gain in 24 hours or 5 pounds in 1 week 2) shortness of breath, with or without a dry hacking cough 3) swelling in the hands, feet or stomach 4) if you have to sleep on extra pillows at night in order to breathe.    Complete by:  As directed    Diet - low sodium heart healthy    Complete by:  As directed    Increase activity slowly    Complete by:  As directed      Allergies as of 12/05/2016      Reactions   Codeine    hallucinations   Penicillins Other (See Comments)   Yeast infection      Medication List    TAKE these medications   acetaminophen 500 MG tablet Commonly known as:  TYLENOL Take 500 mg by mouth every 6  (six) hours as needed for mild pain.   apixaban 5 MG Tabs tablet Commonly known as:  ELIQUIS Take 1 tablet (5 mg total) by mouth 2 (two) times daily.   carvedilol 25 MG tablet Commonly known as:  COREG Take 1 tablet (25 mg total) by mouth 2 (two) times daily with a meal.   diltiazem 180 MG 24 hr capsule Commonly known as:  CARTIA XT Take 1 capsule (180 mg total) by mouth daily. What changed:  when to take this   donepezil 23 MG Tabs tablet Commonly known as:  ARICEPT TAKE 1 TABLET (23 MG TOTAL) BY MOUTH DAILY.   furosemide 20 MG tablet Commonly known as:  LASIX Take 1 tablet (20 mg total) by  mouth daily. What changed:  how much to take   lisinopril 20 MG tablet Commonly known as:  PRINIVIL,ZESTRIL TAKE 1 TABLET (20 MG TOTAL) BY MOUTH 2 (TWO) TIMES DAILY   memantine 10 MG tablet Commonly known as:  NAMENDA Take 1 tablet (10 mg total) by mouth 2 (two) times daily.   NON FORMULARY every 14 (fourteen) days. allergy shots   potassium chloride 10 MEQ tablet Commonly known as:  K-DUR Take 1 tablet (10 mEq total) by mouth daily. It is to be taken with lasix   timolol 0.5 % ophthalmic solution Commonly known as:  TIMOPTIC Place 1 drop into both eyes 2 (two) times daily.   VITAMIN B 12 PO Take 1 tablet by mouth daily.       Allergies  Allergen Reactions  . Codeine     hallucinations  . Penicillins Other (See Comments)    Yeast infection      Procedures/Studies: Dg Chest 2 View  Result Date: 12/02/2016 CLINICAL DATA:  Weakness, bilateral leg swelling EXAM: CHEST  2 VIEW COMPARISON:  12/12/1998 third FINDINGS: Patchy severe bilateral airspace disease, right greater than left, most confluent in the right upper lobe. There is cardiomegaly. Small bilateral effusions. No acute bony abnormality. IMPRESSION: Patchy severe bilateral airspace disease, right worse than left. Findings concerning for pneumonia. Cardiomegaly.  Small bilateral effusions. Electronically Signed    By: Charlett Nose M.D.   On: 12/02/2016 11:05   Dg Chest Port 1 View  Result Date: 12/03/2016 CLINICAL DATA:  Acute CHF, weakness EXAM: PORTABLE CHEST 1 VIEW COMPARISON:  12/02/2016 FINDINGS: Bilateral upper lobe/ perihilar opacities, improved. Mild right basilar opacity. Left lung base is obscured, favoring a small left pleural effusion. Cardiomegaly. IMPRESSION: Improving upper lobe/ perihilar opacities, favoring mild interstitial edema, less likely multifocal infection. Associated cardiomegaly. Suspected small left pleural effusion. Electronically Signed   By: Charline Bills M.D.   On: 12/03/2016 14:47       Discharge Exam: Vitals:   12/04/16 2153 12/05/16 0646  BP: 123/67 (!) 141/88  Pulse: 88 83  Resp: 20 19  Temp: 98.7 F (37.1 C) 98.7 F (37.1 C)   Vitals:   12/04/16 1637 12/04/16 2153 12/05/16 0646 12/05/16 0703  BP: (!) 142/90 123/67 (!) 141/88   Pulse: 85 88 83   Resp:  20 19   Temp:  98.7 F (37.1 C) 98.7 F (37.1 C)   TempSrc:  Oral Oral   SpO2:  96% 95%   Weight:    96 kg (211 lb 10.3 oz)  Height:        General: Pt is alert, awake, not in acute distress Cardiovascular: RRR, S1/S2 +, no rubs, no gallops Respiratory: CTA bilaterally, no wheezing, no rhonchi Abdominal: Soft, NT, ND, bowel sounds + Extremities: no edema, no cyanosis    The results of significant diagnostics from this hospitalization (including imaging, microbiology, ancillary and laboratory) are listed below for reference.     Microbiology: Recent Results (from the past 240 hour(s))  Culture, blood (routine x 2) Call MD if unable to obtain prior to antibiotics being given     Status: None (Preliminary result)   Collection Time: 12/02/16  8:14 PM  Result Value Ref Range Status   Specimen Description BLOOD RIGHT HAND  Final   Special Requests BOTTLES DRAWN AEROBIC AND ANAEROBIC 5CC  Final   Culture   Final    NO GROWTH 1 DAY Performed at [facility], [address],  [place],  [place] [address]    Report Status PENDING  Incomplete  Culture, blood (routine x 2) Call MD if unable to obtain prior to antibiotics being given     Status: None (Preliminary result)   Collection Time: 12/02/16  8:15 PM  Result Value Ref Range Status   Specimen Description BLOOD RIGHT HAND  Final   Special Requests BOTTLES DRAWN AEROBIC AND ANAEROBIC 5CC  Final   Culture   Final    NO GROWTH 1 DAY Performed at [facility], [address], [place], [place] [address]    Report Status PENDING  Incomplete     Labs: BNP (last 3 results)  Recent Labs  12/02/16 1127  BNP 201.8*   Basic Metabolic Panel:  Recent Labs Lab 12/02/16 1127 12/03/16 0115 12/04/16 0547 12/05/16 0527  NA 140 141 143 143  K 3.7 3.2* 3.8 3.9  CL 105 107 104 105  CO2 28 28 29  32  GLUCOSE 131* 123* 126* 97  BUN 17 23* 23* 25*  CREATININE 0.89 1.13* 1.00 1.02*  CALCIUM 8.7* 8.5* 9.0 8.5*  MG  --  2.0 1.9 2.1  PHOS  --  3.6  --   --    Liver Function Tests:  Recent Labs Lab 12/02/16 1127 12/03/16 0115  AST 19 16  ALT 20 17  ALKPHOS 89 71  BILITOT 0.8 0.8  PROT 7.2 6.7  ALBUMIN 3.5 3.1*   No results for input(s): LIPASE, AMYLASE in the last 168 hours. No results for input(s): AMMONIA in the last 168 hours. CBC:  Recent Labs Lab 12/02/16 1127 12/03/16 0115  WBC 8.4 7.8  NEUTROABS 6.3  --   HGB 12.6 11.6*  HCT 39.5 36.0  MCV 88.2 86.7  PLT 260 258   Cardiac Enzymes:  Recent Labs Lab 12/02/16 1127 12/02/16 2011 12/03/16 0115 12/03/16 0653  TROPONINI <0.03 <0.03 <0.03 <0.03   BNP: Invalid input(s): POCBNP CBG: No results for input(s): GLUCAP in the last 168 hours. D-Dimer No results for input(s): DDIMER in the last 72 hours. Hgb A1c No results for input(s): HGBA1C in the last 72 hours. Lipid Profile No results for input(s): CHOL, HDL, LDLCALC, TRIG, CHOLHDL, LDLDIRECT in the last 72 hours. Thyroid function studies  Recent Labs  12/03/16 0115  TSH 2.916    Anemia work up No results for input(s): VITAMINB12, FOLATE, FERRITIN, TIBC, IRON, RETICCTPCT in the last 72 hours. Urinalysis    Component Value Date/Time   COLORURINE YELLOW 12/02/2016 1235   APPEARANCEUR CLOUDY (A) 12/02/2016 1235   LABSPEC 1.018 12/02/2016 1235   PHURINE 6.5 12/02/2016 1235   GLUCOSEU NEGATIVE 12/02/2016 1235   HGBUR NEGATIVE 12/02/2016 1235   BILIRUBINUR NEGATIVE 12/02/2016 1235   KETONESUR NEGATIVE 12/02/2016 1235   PROTEINUR 30 (A) 12/02/2016 1235   NITRITE NEGATIVE 12/02/2016 1235   LEUKOCYTESUR NEGATIVE 12/02/2016 1235   Sepsis Labs Invalid input(s): PROCALCITONIN,  WBC,  LACTICIDVEN Microbiology Recent Results (from the past 240 hour(s))  Culture, blood (routine x 2) Call MD if unable to obtain prior to antibiotics being given     Status: None (Preliminary result)   Collection Time: 12/02/16  8:14 PM  Result Value Ref Range Status   Specimen Description BLOOD RIGHT HAND  Final   Special Requests BOTTLES DRAWN AEROBIC AND ANAEROBIC 5CC  Final   Culture   Final    NO GROWTH 1 DAY Performed at [facility], [address], [place], [place] [address]    Report Status PENDING  Incomplete  Culture, blood (routine x 2) Call MD if unable to obtain prior to antibiotics being given     Status: None (Preliminary result)   Collection Time: 12/02/16  8:15 PM  Result Value Ref Range Status   Specimen Description BLOOD RIGHT HAND  Final   Special Requests BOTTLES DRAWN AEROBIC AND ANAEROBIC 5CC  Final   Culture   Final    NO GROWTH 1 DAY Performed at [facility], [address], [place], [place] [address]    Report Status PENDING  Incomplete     Time coordinating discharge: Over 30 minutes  SIGNED:   Calvert Cantor, MD  [facility] 12/05/2016, 9:46 AM Pager   If 7PM-7AM, please contact night-coverage [email or web address] Password [email or web address]

## 2016-12-05 NOTE — Progress Notes (Signed)
Patient given discharge instructions, and verbalized an understanding of all discharge instructions.  Patient agrees with discharge plan, and is being discharged in stable medical condition.  Patient given transportation via wheelchair. 

## 2016-12-05 NOTE — Discharge Instructions (Signed)
Take these discharge papers to the PCP with you.  You should take your wife to the bathroom at least 3-4 x daily and sit her on the commode even if she does not feel she has to urinate.  Weight her daily.  Avoid salty food and excess water intake.  Walk with her in the house 3-4 times a day. Build her stamina up to where you can walk outside with her.  Keep changing urine pads to keep her dry.   Please take all your medications with you for your next visit with your Primary MD. Please request your Primary MD to go over all hospital test results at the follow up. Please ask your Primary MD to get all Hospital records sent to his/her office.  If you experience worsening of your admission symptoms, develop shortness of breath, chest pain, suicidal or homicidal thoughts or a life threatening emergency, you must seek medical attention immediately by calling 911 or calling your MD.  Bonita QuinYou must read the complete instructions/literature along with all the possible adverse reactions/side effects for all the medicines you take including new medications that have been prescribed to you. Take new medicines after you have completely understood and accpet all the possible adverse reactions/side effects.   Do not drive when taking pain medications or sedatives.    Do not take more than prescribed Pain, Sleep and Anxiety Medications  If you have smoked or chewed Tobacco in the last 2 yrs please stop. Stop any regular alcohol and or recreational drug use.  Wear Seat belts while driving.

## 2016-12-05 NOTE — Progress Notes (Signed)
Nutrition Education Note  RD consulted for nutrition education regarding new onset CHF.  RD provided "Low Sodium Nutrition Therapy" handout from the Academy of [employer]. Reviewed patient's dietary recall. Provided examples on ways to decrease sodium intake in diet. Discouraged intake of processed foods and use of salt shaker. Encouraged fresh fruits and vegetables as well as whole grain sources of carbohydrates to maximize fiber intake.   RD discussed why it is important for patient to adhere to diet recommendations, and emphasized the role of fluids, foods to avoid, and importance of weighing self daily. Teach back method used.  Expect fair compliance.  Body mass index is 36.33 kg/m. Pt meets criteria for obese based on current BMI.  Current diet order is heart healthy, patient is consuming approximately 100% of meals at this time. Labs and medications reviewed. No further nutrition interventions warranted at this time. RD contact information provided. If additional nutrition issues arise, please re-consult RD.   Savannah Hinton, RD, LDN Pager #- [phone]

## 2016-12-08 LAB — CULTURE, BLOOD (ROUTINE X 2)
CULTURE: NO GROWTH
Culture: NO GROWTH

## 2016-12-13 ENCOUNTER — Other Ambulatory Visit: Payer: Self-pay | Admitting: Cardiology

## 2016-12-15 DIAGNOSIS — E7800 Pure hypercholesterolemia, unspecified: Secondary | ICD-10-CM | POA: Diagnosis not present

## 2016-12-15 DIAGNOSIS — I10 Essential (primary) hypertension: Secondary | ICD-10-CM | POA: Diagnosis not present

## 2016-12-15 DIAGNOSIS — Z0000 Encounter for general adult medical examination without abnormal findings: Secondary | ICD-10-CM | POA: Diagnosis not present

## 2016-12-15 DIAGNOSIS — I509 Heart failure, unspecified: Secondary | ICD-10-CM | POA: Diagnosis not present

## 2016-12-19 DIAGNOSIS — J3089 Other allergic rhinitis: Secondary | ICD-10-CM | POA: Diagnosis not present

## 2016-12-19 DIAGNOSIS — J301 Allergic rhinitis due to pollen: Secondary | ICD-10-CM | POA: Diagnosis not present

## 2016-12-19 DIAGNOSIS — H1045 Other chronic allergic conjunctivitis: Secondary | ICD-10-CM | POA: Diagnosis not present

## 2017-02-16 DIAGNOSIS — F0390 Unspecified dementia without behavioral disturbance: Secondary | ICD-10-CM | POA: Diagnosis not present

## 2017-02-16 DIAGNOSIS — I482 Chronic atrial fibrillation: Secondary | ICD-10-CM | POA: Diagnosis not present

## 2017-02-16 DIAGNOSIS — R109 Unspecified abdominal pain: Secondary | ICD-10-CM | POA: Diagnosis not present

## 2017-02-16 DIAGNOSIS — R627 Adult failure to thrive: Secondary | ICD-10-CM | POA: Diagnosis not present

## 2017-03-09 DIAGNOSIS — N183 Chronic kidney disease, stage 3 (moderate): Secondary | ICD-10-CM | POA: Diagnosis not present

## 2017-03-09 DIAGNOSIS — I10 Essential (primary) hypertension: Secondary | ICD-10-CM | POA: Diagnosis not present

## 2017-03-09 DIAGNOSIS — I509 Heart failure, unspecified: Secondary | ICD-10-CM | POA: Diagnosis not present

## 2017-03-09 DIAGNOSIS — I482 Chronic atrial fibrillation: Secondary | ICD-10-CM | POA: Diagnosis not present

## 2017-03-27 ENCOUNTER — Telehealth: Payer: Self-pay

## 2017-03-27 ENCOUNTER — Other Ambulatory Visit: Payer: Self-pay | Admitting: Cardiology

## 2017-03-27 NOTE — Telephone Encounter (Signed)
SENT NOTES TO SCHEDULING 

## 2017-04-06 DIAGNOSIS — H401131 Primary open-angle glaucoma, bilateral, mild stage: Secondary | ICD-10-CM | POA: Diagnosis not present

## 2017-04-10 ENCOUNTER — Telehealth: Payer: Self-pay | Admitting: Neurology

## 2017-04-10 MED ORDER — DONEPEZIL HCL 5 MG PO TABS: 5.0000 mg | ORAL_TABLET | Freq: Every day | ORAL | 0 refills | Status: DC

## 2017-04-10 NOTE — Telephone Encounter (Signed)
Spoke to pts husband and let him to restart at donezepil 5mg  tabs po daily for 2 wks, then increase to10mg  daily as tolerated.  He verbalized understanding.

## 2017-04-10 NOTE — Telephone Encounter (Signed)
Take donepezil 5 mg daily for one to 2 weeks, if she can still tolerate well, may increase to 10 mg daily

## 2017-04-10 NOTE — Addendum Note (Signed)
Addended by: Guy BeginYOUNG, Angala Hilgers S on: 04/10/2017 01:26 PM   Modules accepted: Orders

## 2017-04-10 NOTE — Telephone Encounter (Signed)
Patient's husband calling to discuss changing dosage of donepezil (ARICEPT) 23 MG TABS tablet. She had been experiencing diarrhea and her PCP discontinued it on 03-08-17.

## 2017-04-10 NOTE — Telephone Encounter (Signed)
Spoke to pts husband, and she was taken off 2 medications (Bp and donezepil) due to causing diarrhea on 03-08-17.  .  She is now on cardizem and he is wanting to restart the donezepil on lower dose,  She is currently not having diarrhea.  Please advise 5 mg or 10mg  tabs.?

## 2017-05-04 MED ORDER — DONEPEZIL HCL 10 MG PO TABS: 10.0000 mg | ORAL_TABLET | Freq: Every day | ORAL | 8 refills | Status: DC

## 2017-05-04 NOTE — Addendum Note (Signed)
Addended by: Hermenia FiscalYOUNG, SANDRA S on: 05/04/2017 11:42 AM   Modules accepted: Orders

## 2017-05-04 NOTE — Telephone Encounter (Signed)
Spoke to pt and he will give one tablet 10mg  po at bedtime.  Will call for problems.

## 2017-05-04 NOTE — Telephone Encounter (Signed)
Pt husband calling back re: the donepezil (ARICEPT) 5 MG tablet, he is calling to inform that pt is doing well on the 5 mg and would like to discuss going up the 10 mg, please call

## 2017-05-09 DIAGNOSIS — I10 Essential (primary) hypertension: Secondary | ICD-10-CM | POA: Diagnosis not present

## 2017-05-09 DIAGNOSIS — F0390 Unspecified dementia without behavioral disturbance: Secondary | ICD-10-CM | POA: Diagnosis not present

## 2017-05-09 DIAGNOSIS — N183 Chronic kidney disease, stage 3 (moderate): Secondary | ICD-10-CM | POA: Diagnosis not present

## 2017-05-09 DIAGNOSIS — I482 Chronic atrial fibrillation: Secondary | ICD-10-CM | POA: Diagnosis not present

## 2017-06-02 ENCOUNTER — Other Ambulatory Visit: Payer: Self-pay | Admitting: Neurology

## 2017-06-06 ENCOUNTER — Telehealth: Payer: Self-pay | Admitting: Neurology

## 2017-06-06 NOTE — Telephone Encounter (Signed)
Pt's husband called in for refill for memantine (NAMENDA) 10 MG tablet . He said she is taking 1 tab bid and took the last tab today. Please send to [facility]

## 2017-06-06 NOTE — Telephone Encounter (Signed)
Called patient's husband back to let him know that refills are on file at the pharmacy.

## 2017-07-03 DIAGNOSIS — H401131 Primary open-angle glaucoma, bilateral, mild stage: Secondary | ICD-10-CM | POA: Diagnosis not present

## 2017-07-10 DIAGNOSIS — N183 Chronic kidney disease, stage 3 (moderate): Secondary | ICD-10-CM | POA: Diagnosis not present

## 2017-07-10 DIAGNOSIS — I509 Heart failure, unspecified: Secondary | ICD-10-CM | POA: Diagnosis not present

## 2017-07-10 DIAGNOSIS — I10 Essential (primary) hypertension: Secondary | ICD-10-CM | POA: Diagnosis not present

## 2017-07-10 DIAGNOSIS — Z23 Encounter for immunization: Secondary | ICD-10-CM | POA: Diagnosis not present

## 2017-08-03 DIAGNOSIS — J3089 Other allergic rhinitis: Secondary | ICD-10-CM | POA: Diagnosis not present

## 2017-08-03 DIAGNOSIS — H1045 Other chronic allergic conjunctivitis: Secondary | ICD-10-CM | POA: Diagnosis not present

## 2017-08-03 DIAGNOSIS — J0100 Acute maxillary sinusitis, unspecified: Secondary | ICD-10-CM | POA: Diagnosis not present

## 2017-08-03 DIAGNOSIS — J301 Allergic rhinitis due to pollen: Secondary | ICD-10-CM | POA: Diagnosis not present

## 2017-09-27 ENCOUNTER — Other Ambulatory Visit: Payer: Self-pay | Admitting: Cardiology

## 2017-09-27 NOTE — Telephone Encounter (Signed)
Pt last saw Dr Anne FuSkains on 05/17/16, he states can see pt on as needed basis, Dr Lovenia KimMark Hepler to rx current medications for pt.  Since pt has not seen Dr Anne FuSkains in over 1 year will need to send refill to Dr Tommi EmeryHepler to be refilled or make OV with Dr Anne FuSkains.  Will send note to pharmacy to forward rx refill request to Dr Tommi EmeryHepler.

## 2017-10-10 ENCOUNTER — Telehealth: Payer: Self-pay | Admitting: Neurology

## 2017-10-10 DIAGNOSIS — N183 Chronic kidney disease, stage 3 (moderate): Secondary | ICD-10-CM | POA: Diagnosis not present

## 2017-10-10 DIAGNOSIS — I482 Chronic atrial fibrillation: Secondary | ICD-10-CM | POA: Diagnosis not present

## 2017-10-10 DIAGNOSIS — I10 Essential (primary) hypertension: Secondary | ICD-10-CM | POA: Diagnosis not present

## 2017-10-10 DIAGNOSIS — I509 Heart failure, unspecified: Secondary | ICD-10-CM | POA: Diagnosis not present

## 2017-10-10 NOTE — Telephone Encounter (Signed)
Pt's husband calling wanting to increase donepezil (ARICEPT) 10 MG tablet to the morning regime, she is currently only taking it at night. She is taking memantine (NAMENDA) 10 MG tablet bid. He said she forgets where the bathroom is, intermittently worsening as the day progresses. Pharmacy: [facility]

## 2017-10-10 NOTE — Telephone Encounter (Signed)
Returned call to patient's husband - states she was previously taking Aricept 23mg  but it was discontinued due to diarrhea. He feels she needs the higher dose along with her memantine 10mg , BID.  He would like to try donepezil 10mg , BID if Dr. Terrace ArabiaYan is in agreement.

## 2017-10-11 MED ORDER — DONEPEZIL HCL 10 MG PO TABS: 10.0000 mg | ORAL_TABLET | Freq: Two times a day (BID) | ORAL | 11 refills | Status: DC

## 2017-10-11 NOTE — Telephone Encounter (Signed)
Spoke to Dr. Terrace ArabiaYan and she gave verbal okay for patient to try donepezil 10mg , BID.  New rx sent to pharmacy.  Pt's husband aware.

## 2017-12-04 NOTE — Progress Notes (Signed)
[facility]  PATIENT: Savannah Hinton DOB: 1935/04/27   REASON FOR VISIT: Follow-up for Alzheimer's disease HISTORY FROM: Husband    HISTORY OF PRESENT ILLNESS: [name] is a 83 years old right-handed Caucasian female, referred by her primary care physician Dr. Marinda Elk, for evaluation of memory loss. Initial evaluation was February 2016  She had past medical history of hypertension, bilateral cataract surgery, right hand surgery, had 12 years of education, is a retired [employer] operational officer.  Since 2013, she was noticed to have mild word finding difficulties, memory trouble, difficulty working with members, difficulty to balance her checkbook, she denied visual loss, she has good appetite, sleeping well, she has occasionally diarrhea with greasy food.   Both of her parents died of stroke at age 1s, her older sister died of Alzheimer's at age 53   MRI of the brain in 2014, demonstrated moderate atrophy, mainly involving bilateral frontal, parietal, temporal region, mild small vessel disease, laboratory showed low normal B12, 220, rest of the laboratory was normal including RPR, TSH, ANA, CMP with exception of mild elevated glucose 130, She is taking B12 supplement by mouth. She does not exercise. Appetite is reportedly good she is sleeping well. She denies side effects to Aricept .   She continues to drive short distances with out getting lost. No longer cooks. Independent in dressing bathing and eating. She returns for reevaluation  UPDATE Sep 5th 2017: She is with her husband, she went to ED on July 17th 2017, she had sudden onset vertigo, lasted for few minutes.  She has been sleeping on recliner, scared to have recurrent vertigo with sudden positional change such as lying down,  I personally reviewed MRI July 17th 2017, there was no acute abnormality, mild cerebral atrophy and small vessel disease,  Laboratory evaluations, normal INR CMP, with exception of mild  elevated glucose 109, normal CBC  She has slow worsening memory loss, no longer cooking, able to do simple house chore, able to maintain is no hygiene, dressing independently,  Update November 29 2016: She is here with her husband, overall doing very well, taking Aricept 23 mg daily, Namenda 10 mg twice a day, slight worsening of her memory loss, Mini-Mental Status Examination 16/30, animal naming 4. UPDATE 3/12/2019CM Ms. Stroh 83 year old female returns for follow-up with history of Alzheimer's dementia.  Last MMSE 16 out of 30.  Today 6 out of 30.  She remains on Aricept 10 mg twice daily and Namenda 10 mg twice daily without significant side effects.  She is dependent on her husband for all ADLs.  She now has to be fed.  He supplements her diet with Ensure.  Fortunately no recent falls.  She is continent of bowel and bladder and wears depends.  Husband says she is up to the bathroom 5-6 times every night.  He is going to discuss a urologist referral with primary care in the next few weeks .She returns for reevaluation  REVIEW OF SYSTEMS: Full 14 system review of systems performed and notable only for those listed, all others are neg:  Constitutional: neg  Cardiovascular: neg Ear/Nose/Throat: neg  Skin: neg Eyes: neg Respiratory: neg Gastroitestinal: Incontinence of bowel and bladder Hematology/Lymphatic: neg  Endocrine: neg Musculoskeletal:neg Allergy/Immunology: neg Neurological: Significant memory loss Psychiatric: neg Sleep : neg   ALLERGIES: Allergies  Allergen Reactions  . Codeine     hallucinations  . Penicillins Other (See Comments)    Yeast infection     HOME MEDICATIONS: Outpatient Medications Prior to  Visit  Medication Sig Dispense Refill  . acetaminophen (TYLENOL) 500 MG tablet Take 500 mg by mouth every 6 (six) hours as needed for mild pain.    Marland Kitchen CARTIA XT 120 MG 24 hr capsule 120 mg 2 (two) times daily.  3  . carvedilol (COREG) 3.125 MG tablet TK 1 T PO IN  THE MORNING AND IN THE EVE  2  . Cyanocobalamin (VITAMIN B 12 PO) Take 1 tablet by mouth daily.     Marland Kitchen donepezil (ARICEPT) 10 MG tablet Take 1 tablet (10 mg total) by mouth 2 (two) times daily. 60 tablet 11  . ELIQUIS 5 MG TABS tablet TAKE 1 TABLET(5 MG) BY MOUTH TWICE DAILY 60 tablet 5  . lisinopril (PRINIVIL,ZESTRIL) 10 MG tablet 10 mg 2 (two) times daily.  3  . loperamide (IMODIUM) 2 MG capsule Take by mouth as needed for diarrhea or loose stools.    . memantine (NAMENDA) 10 MG tablet Take 1 tablet (10 mg total) by mouth 2 (two) times daily. 180 tablet 4  . NON FORMULARY every 14 (fourteen) days. allergy shots    . timolol (TIMOPTIC) 0.5 % ophthalmic solution Place 1 drop into both eyes 2 (two) times daily.     Marland Kitchen CARTIA XT 180 MG 24 hr capsule TAKE 1 CAPSULE(180 MG) BY MOUTH DAILY 30 capsule 5  . carvedilol (COREG) 25 MG tablet Take 1 tablet (25 mg total) by mouth 2 (two) times daily with a meal. 60 tablet 0  . furosemide (LASIX) 20 MG tablet Take 1 tablet (20 mg total) by mouth daily. 30 tablet 3  . lisinopril (PRINIVIL,ZESTRIL) 20 MG tablet TAKE 1 TABLET(20 MG) BY MOUTH TWICE DAILY 60 tablet 5  . potassium chloride (K-DUR) 10 MEQ tablet Take 1 tablet (10 mEq total) by mouth daily. It is to be taken with lasix 30 tablet 0   No facility-administered medications prior to visit.     PAST MEDICAL HISTORY: Past Medical History:  Diagnosis Date  . Allergy   . Atrial fibrillation (HCC)   . Dementia   . Glaucoma   . Hyperlipidemia   . Hypertension   . Memory loss   . Vertigo     PAST SURGICAL HISTORY: Past Surgical History:  Procedure Laterality Date  . APPENDECTOMY  1974  . CHOLECYSTECTOMY  2012  . RETINAL DETACHMENT SURGERY      FAMILY HISTORY: Family History  Problem Relation Age of Onset  . Stroke Mother   . Stroke Father   . Cancer Sister        breast  . Heart attack Paternal Uncle     SOCIAL HISTORY: Social History   Socioeconomic History  . Marital status:  Married    Spouse name: Johnnie  . Number of children: 1  . Years of education: Not on file  . Highest education level: Not on file  Social Needs  . Financial resource strain: Not on file  . Food insecurity - worry: Not on file  . Food insecurity - inability: Not on file  . Transportation needs - medical: Not on file  . Transportation needs - non-medical: Not on file  Occupational History    Comment: retired  Tobacco Use  . Smoking status: Never Smoker  . Smokeless tobacco: Never Used  Substance and Sexual Activity  . Alcohol use: No    Alcohol/week: 0.0 oz  . Drug use: No  . Sexual activity: Not on file  Other Topics Concern  . Not on file  Social History Narrative   Patient lives at home with her husband [name](Johhnie ) and [name].   Retired.   Caffeine- half cup of coffee.   Right handed.        PHYSICAL EXAM  Vitals:   12/05/17 1337  BP: 126/88  Pulse: 66  Weight: 179 lb (81.2 kg)   Body mass index is 30.73 kg/m.  Generalized: Well developed, in no acute distress , well groomed Head: normocephalic and atraumatic,. Oropharynx benign  Neck: Supple,  Musculoskeletal: No deformity   Neurological examination   Mentation: Alert , AFT 2 MMSE - Mini Mental State Exam 12/05/2017 11/29/2016 05/31/2016  Orientation to time 0 3 3  Orientation to Place 2 4 4   Registration 1 3 3   Attention/ Calculation 0 1 0  Recall 0 0 1  Language- name 2 objects 2 2 2   Language- repeat 0 0 0  Language- follow 3 step command 0 1 3  Language- read & follow direction 1 1 1   Write a sentence 0 1 1  Copy design 0 0 0  Total score 6 16 18    Follows one step  Commands, minimal speech .   Cranial nerve II-XII: .Pupils were equal round reactive to light extraocular movements were full, visual field were full on confrontational test. Facial sensation and strength were normal. hearing was intact to finger rubbing bilaterally. Uvula tongue midline. head turning and  shoulder shrug were normal and symmetric.Tongue protrusion into cheek strength was normal. Motor: normal bulk and tone, full strength in the BUE, BLE, Sensory: Withdraws to pain   Coordination: finger-nose-finger, heel-to-shin bilaterally, no dysmetria Reflexes: Symmetric upper and lower plantar responses were flexor bilaterally. Gait and Station: Rising up from seated position with push off, wide-based stance no difficulty with turns unable to heel toe or tandem walk .  No assistive device  DIAGNOSTIC DATA (LABS, IMAGING, TESTING) - I reviewed patient records, labs, notes, testing and imaging myself where available.  Lab Results  Component Value Date   WBC 7.8 12/03/2016   HGB 11.6 (L) 12/03/2016   HCT 36.0 12/03/2016   MCV 86.7 12/03/2016   PLT 258 12/03/2016      Component Value Date/Time   NA 143 12/05/2016 0527   NA 142 07/11/2013 1316   K 3.9 12/05/2016 0527   CL 105 12/05/2016 0527   CO2 32 12/05/2016 0527   GLUCOSE 97 12/05/2016 0527   BUN 25 (H) 12/05/2016 0527   BUN 20 07/11/2013 1316   CREATININE 1.02 (H) 12/05/2016 0527   CALCIUM 8.5 (L) 12/05/2016 0527   PROT 6.7 12/03/2016 0115   PROT 7.0 07/11/2013 1316   ALBUMIN 3.1 (L) 12/03/2016 0115   ALBUMIN 4.5 07/11/2013 1316   AST 16 12/03/2016 0115   ALT 17 12/03/2016 0115   ALKPHOS 71 12/03/2016 0115   BILITOT 0.8 12/03/2016 0115   GFRNONAA 51 (L) 12/05/2016 0527   GFRAA 59 (L) 12/05/2016 0527     Lab Results  Component Value Date   TSH 2.916 12/03/2016      ASSESSMENT AND PLAN    83 y.o. year old femalewith severe Alzheimer's dementia.  MMSE 6/30, husband denies behavior issues.              PLAN: Continue Aricept 10 mg twice daily Continue Namenda 10 mg twice daily Keep patient safe Encourage hydration,  Follow-up 6-8 months I spent 25 minutes in total face to face time with the husband  more  than 50% of which was spent counseling and coordination of care, reviewing test results reviewing  medications and discussing and reviewing the diagnosis of severe Alzheimer's and care planning for the future.  Husband's desire to keep her at home as long as possible.  She is total care.  I have encouraged him to reach out to his church group for other friends to give him a few hours of respite.  Discussed home safety.  Keep things simple regular routine. Nilda Riggs, [facility], [facility], APRN  [facility] [address], [address] [place], [place] [address] [phone]

## 2017-12-05 ENCOUNTER — Encounter (INDEPENDENT_AMBULATORY_CARE_PROVIDER_SITE_OTHER): Payer: Self-pay

## 2017-12-05 ENCOUNTER — Encounter: Payer: Self-pay | Admitting: Nurse Practitioner

## 2017-12-05 ENCOUNTER — Ambulatory Visit: Payer: Medicare Other | Admitting: Nurse Practitioner

## 2017-12-05 VITALS — BP 126/88 | HR 66 | Wt 179.0 lb

## 2017-12-05 DIAGNOSIS — G309 Alzheimer's disease, unspecified: Secondary | ICD-10-CM

## 2017-12-05 DIAGNOSIS — F0280 Dementia in other diseases classified elsewhere without behavioral disturbance: Secondary | ICD-10-CM

## 2017-12-05 MED ORDER — MEMANTINE HCL 10 MG PO TABS: 10.0000 mg | ORAL_TABLET | Freq: Two times a day (BID) | ORAL | 8 refills | Status: DC

## 2017-12-05 NOTE — Progress Notes (Signed)
I have reviewed and agreed above plan. 

## 2017-12-05 NOTE — Patient Instructions (Addendum)
Continue Aricept 10 mg twice daily Continue Namenda 10 mg twice daily Keep patient safe Encourage hydration,  Follow-up 6-8 months  Alzheimer Disease Caregiver Guide A person who has Alzheimer disease may not be able to take care of himself or herself. He or she may need help with simple tasks. The tips below can help you care for the person. Memory loss and confusion If the person is confused or cannot remember things:  Stay calm.  Respond with a short answer.  Avoid correcting him or her in a way that sounds like scolding.  Try not to take it personally, even if he or she forgets your name.  Behavior changes The person may go through behavior changes. This can include depression, anxiety, anger, or seeing things that are not there. When behavior changes:  Try not to take behavior changes personally.  Stay calm and patient.  Do not argue or try to convince the person about a specific point.  Know that these changes are part of the disease process. Try to work through it.  Tips to lessen frustration  Make appointments and do daily tasks when the person is at his or her best.  Take your time. Simple tasks may take longer. Allow plenty of time to complete tasks.  Limit choices for the person.  Involve the person in what you are doing.  Stick to a routine.  Avoid new or crowded places, if possible.  Use simple words, short sentences, and a calm voice. Only give 1 direction at a time.  Buy clothes and shoes that are easy to put on and take off.  Let people help if they offer. Home safety  Keep floors clear. Remove rugs, magazine racks, and floor lamps.  Keep hallways well lit.  Put a handrail and nonslip mat in the bathtub or shower.  Put childproof locks on cabinets that have dangerous items in them. These items include medicine, alcohol, guns, toxic cleaning items, sharp tools, matches, or lighters.  Place locks on doors where the person cannot see or reach  them. This helps the person to not wander out of the house and get lost.  Be prepared for emergencies. Keep a list of emergency phone numbers and addresses in a handy area. Plans for the future  Talk about finances. ? Talk about money management. People with Alzheimer disease have trouble managing their money as the disease gets worse. ? Get help from professional advisors about financial and legal matters.  Talk about future care. ? Choose a power of attorney. This is someone who can make decisions for the person with Alzheimer disease when he or she can no longer do so. ? Talk about driving and when it is the right time to stop. The person's doctor can help with this. ? If the person lives alone, make sure he or she is safe. Some people need extra help at home. Other people need more care at a nursing home or care center. Support groups Some benefits of joining a support group include:  Learning ways to manage stress.  Sharing experiences with others.  Getting emotional comfort and support.  Learning new caregiving skills as the disease progresses.  Knowing what community resources are available and taking advantage of them.  Get help if:  The person has a fever.  The person has a sudden behavior change that does not get better with calming strategies.  The person is unable to manage his or her living situation.  The person threatens you  or anyone else, including himself or herself.  You are no longer able to care for the person. This information is not intended to replace advice given to you by your health care provider. Make sure you discuss any questions you have with your health care provider. Document Released: 12/05/2011 Document Revised: 02/18/2016 Document Reviewed: 11/02/2011 Elsevier Interactive Patient Education  2017 [employer]

## 2017-12-12 DIAGNOSIS — I509 Heart failure, unspecified: Secondary | ICD-10-CM | POA: Diagnosis not present

## 2017-12-12 DIAGNOSIS — N183 Chronic kidney disease, stage 3 (moderate): Secondary | ICD-10-CM | POA: Diagnosis not present

## 2017-12-12 DIAGNOSIS — I10 Essential (primary) hypertension: Secondary | ICD-10-CM | POA: Diagnosis not present

## 2017-12-12 DIAGNOSIS — R5383 Other fatigue: Secondary | ICD-10-CM | POA: Diagnosis not present

## 2017-12-12 DIAGNOSIS — I482 Chronic atrial fibrillation: Secondary | ICD-10-CM | POA: Diagnosis not present

## 2017-12-20 DIAGNOSIS — R319 Hematuria, unspecified: Secondary | ICD-10-CM | POA: Diagnosis not present

## 2017-12-20 DIAGNOSIS — N390 Urinary tract infection, site not specified: Secondary | ICD-10-CM | POA: Diagnosis not present

## 2018-01-10 DIAGNOSIS — I482 Chronic atrial fibrillation: Secondary | ICD-10-CM | POA: Diagnosis not present

## 2018-01-10 DIAGNOSIS — I10 Essential (primary) hypertension: Secondary | ICD-10-CM | POA: Diagnosis not present

## 2018-01-10 DIAGNOSIS — I509 Heart failure, unspecified: Secondary | ICD-10-CM | POA: Diagnosis not present

## 2018-01-10 DIAGNOSIS — Z0000 Encounter for general adult medical examination without abnormal findings: Secondary | ICD-10-CM | POA: Diagnosis not present

## 2018-01-25 DIAGNOSIS — H1045 Other chronic allergic conjunctivitis: Secondary | ICD-10-CM | POA: Diagnosis not present

## 2018-01-25 DIAGNOSIS — J301 Allergic rhinitis due to pollen: Secondary | ICD-10-CM | POA: Diagnosis not present

## 2018-01-25 DIAGNOSIS — J3089 Other allergic rhinitis: Secondary | ICD-10-CM | POA: Diagnosis not present

## 2018-03-12 DIAGNOSIS — H00015 Hordeolum externum left lower eyelid: Secondary | ICD-10-CM | POA: Diagnosis not present

## 2018-03-12 DIAGNOSIS — H401131 Primary open-angle glaucoma, bilateral, mild stage: Secondary | ICD-10-CM | POA: Diagnosis not present

## 2018-04-09 DIAGNOSIS — I509 Heart failure, unspecified: Secondary | ICD-10-CM | POA: Diagnosis not present

## 2018-04-09 DIAGNOSIS — E7800 Pure hypercholesterolemia, unspecified: Secondary | ICD-10-CM | POA: Diagnosis not present

## 2018-04-09 DIAGNOSIS — I10 Essential (primary) hypertension: Secondary | ICD-10-CM | POA: Diagnosis not present

## 2018-04-09 DIAGNOSIS — I482 Chronic atrial fibrillation: Secondary | ICD-10-CM | POA: Diagnosis not present

## 2018-04-11 DIAGNOSIS — I482 Chronic atrial fibrillation: Secondary | ICD-10-CM | POA: Diagnosis not present

## 2018-04-11 DIAGNOSIS — I10 Essential (primary) hypertension: Secondary | ICD-10-CM | POA: Diagnosis not present

## 2018-04-11 DIAGNOSIS — N3946 Mixed incontinence: Secondary | ICD-10-CM | POA: Diagnosis not present

## 2018-04-11 DIAGNOSIS — I509 Heart failure, unspecified: Secondary | ICD-10-CM | POA: Diagnosis not present

## 2018-06-04 NOTE — Progress Notes (Signed)
[facility]  PATIENT: Savannah Hinton DOB: [birth year]   REASON FOR VISIT: Follow-up for Alzheimer's disease HISTORY FROM: Husband    HISTORY OF PRESENT ILLNESS: Savannah Hinton is a 83 years old right-handed Caucasian female, referred by her primary care physician Dr. Marinda Elk, for evaluation of memory loss. Initial evaluation was February 2016  She had past medical history of hypertension, bilateral cataract surgery, right hand surgery, had 12 years of education, is a retired [employer] operational officer.  Since 2013, she was noticed to have mild word finding difficulties, memory trouble, difficulty working with members, difficulty to balance her checkbook, she denied visual loss, she has good appetite, sleeping well, she has occasionally diarrhea with greasy food.   Both of her parents died of stroke at age 10s, her older sister died of Alzheimer's at age 59   MRI of the brain in 2014, demonstrated moderate atrophy, mainly involving bilateral frontal, parietal, temporal region, mild small vessel disease, laboratory showed low normal B12, 220, rest of the laboratory was normal including RPR, TSH, ANA, CMP with exception of mild elevated glucose 130, She is taking B12 supplement by mouth. She does not exercise. Appetite is reportedly good she is sleeping well. She denies side effects to Aricept .   She continues to drive short distances with out getting lost. No longer cooks. Independent in dressing bathing and eating. She returns for reevaluation  UPDATE Sep 5th 2017: She is with her husband, she went to ED on July 17th 2017, she had sudden onset vertigo, lasted for few minutes.  She has been sleeping on recliner, scared to have recurrent vertigo with sudden positional change such as lying down,  I personally reviewed MRI July 17th 2017, there was no acute abnormality, mild cerebral atrophy and small vessel disease,  Laboratory evaluations, normal INR CMP, with  exception of mild elevated glucose 109, normal CBC  She has slow worsening memory loss, no longer cooking, able to do simple house chore, able to maintain is no hygiene, dressing independently,  Update November 29 2016: She is here with her husband, overall doing very well, taking Aricept 23 mg daily, Namenda 10 mg twice a day, slight worsening of her memory loss, Mini-Mental Status Examination 16/30, animal naming 4. UPDATE 3/12/2019CM Savannah Hinton 83 year old female returns for follow-up with history of Alzheimer's dementia.  Last MMSE 16 out of 30.  Today 6 out of 30.  She remains on Aricept 10 mg twice daily and Namenda 10 mg twice daily without significant side effects.  She is dependent on her husband for all ADLs.  She now has to be fed.  He supplements her diet with Ensure.  Fortunately no recent falls.  She is incontinent of bowel and bladder and wears depends.  Husband says she is up to the bathroom 5-6 times every night.  He is going to discuss a urologist referral with primary care in the next few weeks .She returns for reevaluation UPDATE 9/10/2019CM Savannah Hinton, 83 year old female returns for follow-up with a history of Alzheimer's dementia.  MMSE not repeated last was 6 out of 30.  She is currently on Aricept 10 mg twice daily ordered by Dr. Terrace Arabia and Namenda 10 mg twice daily without significant side effects.  Husband reports they sleep late and only have 2 meals a day.  She has been placed on Remeron by her primary care which is helped her appetite.  No recent falls.  She is total care, she is incontinent  of bowel and bladder.  She does not follow commands.  There have been no behavior issues issues.  She does drink Ensure between meals she returns for reevaluation REVIEW OF SYSTEMS: Full 14 system review of systems performed and notable only for those listed, all others are neg:  Constitutional: neg  Cardiovascular: neg Ear/Nose/Throat: neg  Skin: neg Eyes: neg Respiratory:  neg Gastroitestinal: Incontinence of bowel and bladder Hematology/Lymphatic: neg  Endocrine: neg Musculoskeletal:neg Allergy/Immunology: neg Neurological: Significant memory loss, speech difficulty Psychiatric: Anxiety Sleep : neg   ALLERGIES: Allergies  Allergen Reactions  . Codeine     hallucinations  . Penicillins Other (See Comments)    Yeast infection     HOME MEDICATIONS: Outpatient Medications Prior to Visit  Medication Sig Dispense Refill  . acetaminophen (TYLENOL) 500 MG tablet Take 500 mg by mouth every 6 (six) hours as needed for mild pain.    Marland Kitchen CARTIA XT 120 MG 24 hr capsule 120 mg 2 (two) times daily.  3  . carvedilol (COREG) 3.125 MG tablet TK 1 T PO IN THE MORNING AND IN THE EVE  2  . donepezil (ARICEPT) 10 MG tablet Take 1 tablet (10 mg total) by mouth 2 (two) times daily. 60 tablet 11  . ELIQUIS 5 MG TABS tablet TAKE 1 TABLET(5 MG) BY MOUTH TWICE DAILY 60 tablet 5  . loperamide (IMODIUM) 2 MG capsule Take by mouth as needed for diarrhea or loose stools.    . memantine (NAMENDA) 10 MG tablet Take 1 tablet (10 mg total) by mouth 2 (two) times daily. 60 tablet 8  . mirtazapine (REMERON) 15 MG tablet TK ONE-HALF TO 1 T PO QD HS  2  . timolol (TIMOPTIC) 0.5 % ophthalmic solution Place 1 drop into both eyes 2 (two) times daily.     . Cyanocobalamin (VITAMIN B 12 PO) Take 1 tablet by mouth daily.     . NON FORMULARY every 14 (fourteen) days. allergy shots    . lisinopril (PRINIVIL,ZESTRIL) 10 MG tablet 10 mg 2 (two) times daily.  3   No facility-administered medications prior to visit.     PAST MEDICAL HISTORY: Past Medical History:  Diagnosis Date  . Allergy   . Atrial fibrillation (HCC)   . Dementia   . Glaucoma   . Hyperlipidemia   . Hypertension   . Memory loss   . Vertigo     PAST SURGICAL HISTORY: Past Surgical History:  Procedure Laterality Date  . APPENDECTOMY  1974  . CHOLECYSTECTOMY  2012  . RETINAL DETACHMENT SURGERY      FAMILY  HISTORY: Family History  Problem Relation Age of Onset  . Stroke Mother   . Stroke Father   . Cancer Sister        breast  . Heart attack Paternal Uncle     SOCIAL HISTORY: Social History   Socioeconomic History  . Marital status: Married    Spouse name: Johnnie  . Number of children: 1  . Years of education: Not on file  . Highest education level: Not on file  Occupational History    Comment: retired  Engineer, production  . Financial resource strain: Not on file  . Food insecurity:    Worry: Not on file    Inability: Not on file  . Transportation needs:    Medical: Not on file    Non-medical: Not on file  Tobacco Use  . Smoking status: Never Smoker  . Smokeless tobacco: Never Used  Substance and  Sexual Activity  . Alcohol use: No    Alcohol/week: 0.0 standard drinks  . Drug use: No  . Sexual activity: Not on file  Lifestyle  . Physical activity:    Days per week: Not on file    Minutes per session: Not on file  . Stress: Not on file  Relationships  . Social connections:    Talks on phone: Not on file    Gets together: Not on file    Attends religious service: Not on file    Active member of club or organization: Not on file    Attends meetings of clubs or organizations: Not on file    Relationship status: Not on file  . Intimate partner violence:    Fear of current or ex partner: Not on file    Emotionally abused: Not on file    Physically abused: Not on file    Forced sexual activity: Not on file  Other Topics Concern  . Not on file  Social History Narrative   Patient lives at home with her husband [name] ) and her son was with her also Onalee Hua.   Retired.   Caffeine- half cup of coffee.   Right handed.        PHYSICAL EXAM  Vitals:   06/05/18 1333  BP: (!) 151/78  Pulse: 63  Weight: 164 lb 3.2 oz (74.5 kg)  Height: 5\' 4"  (1.626 m)   Body mass index is 28.18 kg/m.  Generalized: Well developed, in no acute distress , well groomed Head:  normocephalic and atraumatic,. Oropharynx benign  Neck: Supple,  Musculoskeletal: No deformity   Neurological examination   Mentation: Alert , AFT 2 MMSE - Mini Mental State Exam 12/05/2017 11/29/2016 05/31/2016  Orientation to time 0 3 3  Orientation to Place 2 4 4   Registration 1 3 3   Attention/ Calculation 0 1 0  Recall 0 0 1  Language- name 2 objects 2 2 2   Language- repeat 0 0 0  Language- follow 3 step command 0 1 3  Language- read & follow direction 1 1 1   Write a sentence 0 1 1  Copy design 0 0 0  Total score 6 16 18    Follows one step  Commands, minimal speech .   Cranial nerve II-XII: .Pupils were equal round reactive to light extraocular movements were full, visual field were full on confrontational test. Facial sensation and strength were normal. hearing was intact to finger rubbing bilaterally. Uvula tongue midline. head turning and shoulder shrug were normal and symmetric.Tongue protrusion into cheek strength was normal. Motor: normal bulk and tone, full strength in the BUE, BLE, Sensory: Withdraws to pain   Coordination: finger-nose-finger, with apraxia Reflexes: Symmetric upper and lower plantar responses were flexor bilaterally. Gait and Station: Rising up from seated position with push off, wide-based stance no difficulty with turns unable to heel toe or tandem walk .  No assistive device.  Walks with standby assist  DIAGNOSTIC DATA (LABS, IMAGING, TESTING) - I reviewed patient records, labs, notes, testing and imaging myself where available.  Lab Results  Component Value Date   WBC 7.8 12/03/2016   HGB 11.6 (L) 12/03/2016   HCT 36.0 12/03/2016   MCV 86.7 12/03/2016   PLT 258 12/03/2016      Component Value Date/Time   NA 143 12/05/2016 0527   NA 142 07/11/2013 1316   K 3.9 12/05/2016 0527   CL 105 12/05/2016 0527   CO2 32 12/05/2016 0527   GLUCOSE  97 12/05/2016 0527   BUN 25 (H) 12/05/2016 0527   BUN 20 07/11/2013 1316   CREATININE 1.02 (H) 12/05/2016  0527   CALCIUM 8.5 (L) 12/05/2016 0527   PROT 6.7 12/03/2016 0115   PROT 7.0 07/11/2013 1316   ALBUMIN 3.1 (L) 12/03/2016 0115   ALBUMIN 4.5 07/11/2013 1316   AST 16 12/03/2016 0115   ALT 17 12/03/2016 0115   ALKPHOS 71 12/03/2016 0115   BILITOT 0.8 12/03/2016 0115   GFRNONAA 51 (L) 12/05/2016 0527   GFRAA 59 (L) 12/05/2016 0527     Lab Results  Component Value Date   TSH 2.916 12/03/2016      ASSESSMENT AND PLAN    83 y.o. year old femalewith severe Alzheimer's dementia.  MMSE 6/30, husband denies behavior issues.              PLAN: Continue Aricept 10 mg twice daily Continue Namenda 10 mg twice daily Encourage hydration, small frequent meals and snacks Keep environment safe, regular routine Follow-up 6-8 months I spent 25 minutes in total face to face time with the husband  more than 50% of which was spent counseling and coordination of care, reviewing test results reviewing medications and discussing and reviewing the diagnosis of severe Alzheimer's and care planning for the future.  Husband's desire to keep her at home as long as possible.  She is total care.  I have encouraged him to reach out to his church group for other friends to give him a few hours of respite.  Discussed home safety.  Keep things simple regular routine. Nilda Riggs, [facility], [facility], APRN  [facility] [address] [place], [place] [address] [phone]

## 2018-06-05 ENCOUNTER — Ambulatory Visit: Payer: Medicare Other | Admitting: Nurse Practitioner

## 2018-06-05 ENCOUNTER — Encounter: Payer: Self-pay | Admitting: Nurse Practitioner

## 2018-06-05 VITALS — BP 151/78 | HR 63 | Ht 64.0 in | Wt 164.2 lb

## 2018-06-05 DIAGNOSIS — F0280 Dementia in other diseases classified elsewhere without behavioral disturbance: Secondary | ICD-10-CM | POA: Diagnosis not present

## 2018-06-05 DIAGNOSIS — G309 Alzheimer's disease, unspecified: Secondary | ICD-10-CM

## 2018-06-05 MED ORDER — MEMANTINE HCL 10 MG PO TABS: 10.0000 mg | ORAL_TABLET | Freq: Two times a day (BID) | ORAL | 8 refills | Status: DC

## 2018-06-05 MED ORDER — DONEPEZIL HCL 10 MG PO TABS: 10.0000 mg | ORAL_TABLET | Freq: Two times a day (BID) | ORAL | 8 refills | Status: DC

## 2018-06-05 NOTE — Progress Notes (Signed)
I have reviewed and agreed above plan. 

## 2018-06-05 NOTE — Patient Instructions (Addendum)
Continue Aricept 10 mg twice daily Continue Namenda 10 mg twice daily Encourage hydration,  Follow-up 6-8 months

## 2018-06-07 ENCOUNTER — Ambulatory Visit: Payer: Medicare Other | Admitting: Nurse Practitioner

## 2018-06-26 DIAGNOSIS — H401131 Primary open-angle glaucoma, bilateral, mild stage: Secondary | ICD-10-CM | POA: Diagnosis not present

## 2018-07-12 DIAGNOSIS — N183 Chronic kidney disease, stage 3 (moderate): Secondary | ICD-10-CM | POA: Diagnosis not present

## 2018-07-12 DIAGNOSIS — F0390 Unspecified dementia without behavioral disturbance: Secondary | ICD-10-CM | POA: Diagnosis not present

## 2018-07-12 DIAGNOSIS — I482 Chronic atrial fibrillation, unspecified: Secondary | ICD-10-CM | POA: Diagnosis not present

## 2018-07-12 DIAGNOSIS — I10 Essential (primary) hypertension: Secondary | ICD-10-CM | POA: Diagnosis not present

## 2018-08-08 DIAGNOSIS — Z23 Encounter for immunization: Secondary | ICD-10-CM | POA: Diagnosis not present

## 2018-10-17 DIAGNOSIS — R32 Unspecified urinary incontinence: Secondary | ICD-10-CM | POA: Diagnosis not present

## 2018-10-17 DIAGNOSIS — Z86718 Personal history of other venous thrombosis and embolism: Secondary | ICD-10-CM | POA: Diagnosis not present

## 2018-10-17 DIAGNOSIS — G300 Alzheimer's disease with early onset: Secondary | ICD-10-CM | POA: Diagnosis not present

## 2018-10-17 DIAGNOSIS — R627 Adult failure to thrive: Secondary | ICD-10-CM | POA: Diagnosis not present

## 2018-11-15 ENCOUNTER — Encounter: Payer: Self-pay | Admitting: Neurology

## 2018-12-05 ENCOUNTER — Encounter (HOSPITAL_COMMUNITY): Payer: Self-pay | Admitting: Emergency Medicine

## 2018-12-05 ENCOUNTER — Other Ambulatory Visit: Payer: Self-pay

## 2018-12-05 ENCOUNTER — Inpatient Hospital Stay (HOSPITAL_COMMUNITY)
Admission: EM | Admit: 2018-12-05 | Discharge: 2018-12-10 | DRG: 603 | Disposition: A | Discharge status: Home Health | Payer: Medicare Other | Source: Ambulatory Visit | Attending: Internal Medicine | Admitting: Internal Medicine

## 2018-12-05 DIAGNOSIS — K529 Noninfective gastroenteritis and colitis, unspecified: Secondary | ICD-10-CM | POA: Diagnosis present

## 2018-12-05 DIAGNOSIS — Y929 Unspecified place or not applicable: Secondary | ICD-10-CM | POA: Diagnosis not present

## 2018-12-05 DIAGNOSIS — Z823 Family history of stroke: Secondary | ICD-10-CM | POA: Diagnosis not present

## 2018-12-05 DIAGNOSIS — N183 Chronic kidney disease, stage 3 unspecified: Secondary | ICD-10-CM

## 2018-12-05 DIAGNOSIS — L03116 Cellulitis of left lower limb: Secondary | ICD-10-CM

## 2018-12-05 DIAGNOSIS — Z515 Encounter for palliative care: Secondary | ICD-10-CM | POA: Diagnosis not present

## 2018-12-05 DIAGNOSIS — E875 Hyperkalemia: Secondary | ICD-10-CM | POA: Diagnosis not present

## 2018-12-05 DIAGNOSIS — R233 Spontaneous ecchymoses: Secondary | ICD-10-CM | POA: Diagnosis present

## 2018-12-05 DIAGNOSIS — I1310 Hypertensive heart and chronic kidney disease without heart failure, with stage 1 through stage 4 chronic kidney disease, or unspecified chronic kidney disease: Secondary | ICD-10-CM | POA: Diagnosis present

## 2018-12-05 DIAGNOSIS — R350 Frequency of micturition: Secondary | ICD-10-CM | POA: Diagnosis not present

## 2018-12-05 DIAGNOSIS — L0390 Cellulitis, unspecified: Secondary | ICD-10-CM | POA: Diagnosis present

## 2018-12-05 DIAGNOSIS — R5381 Other malaise: Secondary | ICD-10-CM | POA: Diagnosis not present

## 2018-12-05 DIAGNOSIS — Z7901 Long term (current) use of anticoagulants: Secondary | ICD-10-CM

## 2018-12-05 DIAGNOSIS — F0390 Unspecified dementia without behavioral disturbance: Secondary | ICD-10-CM | POA: Diagnosis not present

## 2018-12-05 DIAGNOSIS — L03115 Cellulitis of right lower limb: Secondary | ICD-10-CM | POA: Diagnosis not present

## 2018-12-05 DIAGNOSIS — Z9049 Acquired absence of other specified parts of digestive tract: Secondary | ICD-10-CM | POA: Diagnosis not present

## 2018-12-05 DIAGNOSIS — E785 Hyperlipidemia, unspecified: Secondary | ICD-10-CM | POA: Diagnosis not present

## 2018-12-05 DIAGNOSIS — Z803 Family history of malignant neoplasm of breast: Secondary | ICD-10-CM | POA: Diagnosis not present

## 2018-12-05 DIAGNOSIS — Z880 Allergy status to penicillin: Secondary | ICD-10-CM

## 2018-12-05 DIAGNOSIS — I480 Paroxysmal atrial fibrillation: Secondary | ICD-10-CM | POA: Diagnosis present

## 2018-12-05 DIAGNOSIS — Z8249 Family history of ischemic heart disease and other diseases of the circulatory system: Secondary | ICD-10-CM | POA: Diagnosis not present

## 2018-12-05 DIAGNOSIS — R1310 Dysphagia, unspecified: Secondary | ICD-10-CM | POA: Diagnosis present

## 2018-12-05 DIAGNOSIS — W228XXA Striking against or struck by other objects, initial encounter: Secondary | ICD-10-CM | POA: Diagnosis present

## 2018-12-05 DIAGNOSIS — Z789 Other specified health status: Secondary | ICD-10-CM

## 2018-12-05 DIAGNOSIS — R0602 Shortness of breath: Secondary | ICD-10-CM | POA: Diagnosis not present

## 2018-12-05 DIAGNOSIS — I10 Essential (primary) hypertension: Secondary | ICD-10-CM | POA: Diagnosis not present

## 2018-12-05 DIAGNOSIS — S8011XA Contusion of right lower leg, initial encounter: Secondary | ICD-10-CM | POA: Diagnosis present

## 2018-12-05 DIAGNOSIS — Z79899 Other long term (current) drug therapy: Secondary | ICD-10-CM

## 2018-12-05 DIAGNOSIS — E639 Nutritional deficiency, unspecified: Secondary | ICD-10-CM | POA: Diagnosis not present

## 2018-12-05 DIAGNOSIS — Z7189 Other specified counseling: Secondary | ICD-10-CM | POA: Diagnosis not present

## 2018-12-05 LAB — URINALYSIS, ROUTINE W REFLEX MICROSCOPIC
Bilirubin Urine: NEGATIVE
Glucose, UA: NEGATIVE mg/dL
HGB URINE DIPSTICK: NEGATIVE
Ketones, ur: NEGATIVE mg/dL
Leukocytes,Ua: NEGATIVE
Nitrite: NEGATIVE
PROTEIN: NEGATIVE mg/dL
SPECIFIC GRAVITY, URINE: 1.003 — AB (ref 1.005–1.030)
pH: 7 (ref 5.0–8.0)

## 2018-12-05 LAB — CBC WITH DIFFERENTIAL/PLATELET
ABS IMMATURE GRANULOCYTES: 0.02 10*3/uL (ref 0.00–0.07)
Basophils Absolute: 0.1 10*3/uL (ref 0.0–0.1)
Basophils Relative: 1 %
Eosinophils Absolute: 0.3 10*3/uL (ref 0.0–0.5)
Eosinophils Relative: 4 %
HCT: 43.1 % (ref 36.0–46.0)
HEMOGLOBIN: 13.3 g/dL (ref 12.0–15.0)
Immature Granulocytes: 0 %
LYMPHS PCT: 21 %
Lymphs Abs: 1.7 10*3/uL (ref 0.7–4.0)
MCH: 28.5 pg (ref 26.0–34.0)
MCHC: 30.9 g/dL (ref 30.0–36.0)
MCV: 92.5 fL (ref 80.0–100.0)
MONO ABS: 0.8 10*3/uL (ref 0.1–1.0)
Monocytes Relative: 10 %
NEUTROS ABS: 5.2 10*3/uL (ref 1.7–7.7)
Neutrophils Relative %: 64 %
Platelets: 219 10*3/uL (ref 150–400)
RBC: 4.66 MIL/uL (ref 3.87–5.11)
RDW: 14.6 % (ref 11.5–15.5)
WBC: 8.1 10*3/uL (ref 4.0–10.5)
nRBC: 0 % (ref 0.0–0.2)

## 2018-12-05 LAB — COMPREHENSIVE METABOLIC PANEL
ALBUMIN: 3.8 g/dL (ref 3.5–5.0)
ALT: 21 U/L (ref 0–44)
AST: 25 U/L (ref 15–41)
Alkaline Phosphatase: 93 U/L (ref 38–126)
Anion gap: 9 (ref 5–15)
BILIRUBIN TOTAL: 0.3 mg/dL (ref 0.3–1.2)
BUN: 26 mg/dL — AB (ref 8–23)
CO2: 22 mmol/L (ref 22–32)
Calcium: 8.8 mg/dL — ABNORMAL LOW (ref 8.9–10.3)
Chloride: 104 mmol/L (ref 98–111)
Creatinine, Ser: 1.04 mg/dL — ABNORMAL HIGH (ref 0.44–1.00)
GFR calc Af Amer: 58 mL/min — ABNORMAL LOW (ref >60)
GFR, EST NON AFRICAN AMERICAN: 50 mL/min — AB (ref >60)
Glucose, Bld: 109 mg/dL — ABNORMAL HIGH (ref 70–99)
POTASSIUM: 5.2 mmol/L — AB (ref 3.5–5.1)
Sodium: 135 mmol/L (ref 135–145)
TOTAL PROTEIN: 7.1 g/dL (ref 6.5–8.1)

## 2018-12-05 LAB — LACTIC ACID, PLASMA
Lactic Acid, Venous: 1.2 mmol/L (ref 0.5–1.9)
Lactic Acid, Venous: 0.9 mmol/L (ref 0.5–1.9)

## 2018-12-05 MED ORDER — SODIUM CHLORIDE 0.9 % IV SOLN
INTRAVENOUS | Status: DC
  Administered 2018-12-05 – 2018-12-07 (×5): via INTRAVENOUS

## 2018-12-05 MED ORDER — SALINE SPRAY 0.65 % NA SOLN
1.0000 | NASAL | Status: DC | PRN
  Filled 2018-12-05: qty 44

## 2018-12-05 MED ORDER — SODIUM CHLORIDE 0.9 % IV BOLUS
1000.0000 mL | Freq: Once | INTRAVENOUS | Status: AC
  Administered 2018-12-05: 1000 mL via INTRAVENOUS

## 2018-12-05 MED ORDER — CEFAZOLIN SODIUM-DEXTROSE 1-4 GM/50ML-% IV SOLN
1.0000 g | Freq: Three times a day (TID) | INTRAVENOUS | Status: DC
  Administered 2018-12-05 – 2018-12-10 (×14): 1 g via INTRAVENOUS
  Filled 2018-12-05 (×19): qty 50

## 2018-12-05 MED ORDER — DONEPEZIL HCL 10 MG PO TABS
10.0000 mg | ORAL_TABLET | Freq: Two times a day (BID) | ORAL | Status: DC
  Administered 2018-12-05 – 2018-12-10 (×10): 10 mg via ORAL
  Filled 2018-12-05 (×6): qty 1
  Filled 2018-12-05 (×2): qty 2
  Filled 2018-12-05: qty 1
  Filled 2018-12-05: qty 2
  Filled 2018-12-05: qty 1

## 2018-12-05 MED ORDER — MEMANTINE HCL 10 MG PO TABS
10.0000 mg | ORAL_TABLET | Freq: Two times a day (BID) | ORAL | Status: DC
  Administered 2018-12-05 – 2018-12-10 (×10): 10 mg via ORAL
  Filled 2018-12-05 (×10): qty 1

## 2018-12-05 MED ORDER — TIMOLOL MALEATE 0.5 % OP SOLN
1.0000 [drp] | Freq: Two times a day (BID) | OPHTHALMIC | Status: DC
  Administered 2018-12-05 – 2018-12-10 (×10): 1 [drp] via OPHTHALMIC
  Filled 2018-12-05: qty 5

## 2018-12-05 MED ORDER — ACETAMINOPHEN 650 MG RE SUPP: 650.0000 mg | Freq: Four times a day (QID) | RECTAL | Status: DC | PRN

## 2018-12-05 MED ORDER — CLINDAMYCIN PHOSPHATE 600 MG/50ML IV SOLN
600.0000 mg | Freq: Once | INTRAVENOUS | Status: AC
  Administered 2018-12-05: 600 mg via INTRAVENOUS
  Filled 2018-12-05: qty 50

## 2018-12-05 MED ORDER — LORATADINE 10 MG PO TABS
10.0000 mg | ORAL_TABLET | Freq: Every day | ORAL | Status: DC
  Administered 2018-12-05 – 2018-12-10 (×6): 10 mg via ORAL
  Filled 2018-12-05 (×6): qty 1

## 2018-12-05 MED ORDER — ACETAMINOPHEN 325 MG PO TABS: 650.0000 mg | ORAL_TABLET | Freq: Four times a day (QID) | ORAL | Status: DC | PRN

## 2018-12-05 MED ORDER — APIXABAN 5 MG PO TABS
5.0000 mg | ORAL_TABLET | Freq: Two times a day (BID) | ORAL | Status: DC
  Administered 2018-12-05 – 2018-12-10 (×10): 5 mg via ORAL
  Filled 2018-12-05 (×10): qty 1

## 2018-12-05 NOTE — ED Triage Notes (Signed)
Pt sent from [employer] in [facility] for cellulitis on right leg that been on antibiotics for it for week. Pt has dementia and been more confused and having frequent urination.  Pt having nasal congestion

## 2018-12-05 NOTE — ED Notes (Signed)
Family at bedside. 

## 2018-12-05 NOTE — ED Notes (Signed)
Pt's family reports pt have been having urinary frequency and is more confused.  He also reports worsening cellulitis in her R leg even after taking abx.

## 2018-12-05 NOTE — H&P (Signed)
History and Physical    Savannah Hinton EXB:[id number] DOB: May 30, 1935 DOA: 12/05/2018  PCP: Koren Shiver, DO Patient coming from: Home  Chief Complaint: Right lower extremity redness  HPI: Savannah Hinton is a 84 y.o. female with medical history significant of dementia, A. fib, hypertension, hyperlipidemia presenting to the hospital for evaluation of right lower extremity redness. Son at bedside is patient's caregiver.  States patient has dementia and is oriented to self only at baseline.  States he noticed that she had an area of redness on her right lower leg about 2 weeks ago and the area has progressively been expanding in size.  Patient was previously treated with Bactrim for a week with no improvement.  She was recently started on doxycycline but again no improvement noticed.  He does not think patient has had any fevers, chills, nausea, vomiting, or diarrhea.  Does mention that she has been urinating a lot.  In addition, son is concerned that her blood pressure was low today.  Home readings with systolic in the 90s and diastolic in the 50s.  Son states patient is currently on 2 different blood pressure medications.  Review of Systems: As per HPI otherwise 10 point review of systems negative.  Past Medical History:  Diagnosis Date  . Allergy   . Atrial fibrillation (HCC)   . Dementia (HCC)   . Glaucoma   . Hyperlipidemia   . Hypertension   . Memory loss   . Vertigo     Past Surgical History:  Procedure Laterality Date  . APPENDECTOMY  1974  . CHOLECYSTECTOMY  2012  . RETINAL DETACHMENT SURGERY       reports that she has never smoked. She has never used smokeless tobacco. She reports that she does not drink alcohol or use drugs.  Allergies  Allergen Reactions  . Codeine     hallucinations  . Penicillins Other (See Comments)    Yeast infection Did it involve swelling of the face/tongue/throat, SOB, or low BP? N Did it involve sudden or severe rash/hives, skin  peeling, or any reaction on the inside of your mouth or nose? N Did you need to seek medical attention at a hospital or doctor's office? N When did it last happen?more than 10 years If all above answers are "NO", may proceed with cephalosporin use.      Family History  Problem Relation Age of Onset  . Stroke Mother   . Stroke Father   . Cancer Sister        breast  . Heart attack Paternal Uncle     Prior to Admission medications   Medication Sig Start Date End Date Taking? Authorizing Provider  CARTIA XT 120 MG 24 hr capsule Take 120 mg by mouth daily.  11/28/17  Yes [provider]  carvedilol (COREG) 3.125 MG tablet Take 3.125 mg by mouth 2 (two) times daily with a meal.  11/28/17  Yes [provider]  cetirizine (ZYRTEC) 10 MG tablet Take 10 mg by mouth daily.   Yes [provider]  Cyanocobalamin (VITAMIN B 12 PO) Take 1 tablet by mouth daily.    Yes [provider]  donepezil (ARICEPT) 10 MG tablet Take 1 tablet (10 mg total) by mouth 2 (two) times daily. 06/05/18  Yes Nilda Riggs, NP  doxycycline (VIBRAMYCIN) 100 MG capsule Take 100 mg by mouth 2 (two) times daily.   Yes [provider]  ELIQUIS 5 MG TABS tablet TAKE 1 TABLET(5 MG) BY  MOUTH TWICE DAILY Patient taking differently: Take 5 mg by mouth 2 (two) times daily.  03/27/17  Yes Jake Bathe, MD  fexofenadine (ALLEGRA) 180 MG tablet Take 180 mg by mouth daily.   Yes [provider]  fluconazole (DIFLUCAN) 150 MG tablet Take 150 mg by mouth every other day.   Yes [provider]  glycopyrrolate (ROBINUL) 1 MG tablet Take 0.5-1 mg by mouth 3 (three) times daily.   Yes [provider]  loperamide (IMODIUM) 2 MG capsule Take by mouth as needed for diarrhea or loose stools.   Yes [provider]  memantine (NAMENDA) 10 MG tablet Take 1 tablet (10 mg total) by mouth 2 (two) times daily. 06/05/18  Yes Nilda Riggs, NP  sodium  chloride (OCEAN) 0.65 % SOLN nasal spray Place 1 spray into both nostrils as needed for congestion.   Yes [provider]  timolol (TIMOPTIC) 0.5 % ophthalmic solution Place 1 drop into both eyes 2 (two) times daily.  06/02/11  Yes [provider]  sulfamethoxazole-trimethoprim (BACTRIM DS,SEPTRA DS) 800-160 MG tablet Take 1 tablet by mouth 2 (two) times daily.    [provider]    Physical Exam: Vitals:   12/05/18 1626 12/05/18 1903  BP: 96/82 133/89  Pulse: 60 77  Resp: 13 15  Temp: 97.7 F (36.5 C)   TempSrc: Oral   SpO2: 100% 93%    Physical Exam  Constitutional: No distress.  HENT:  Head: Normocephalic.  Mouth/Throat: Oropharynx is clear and moist.  Eyes: Right eye exhibits no discharge. Left eye exhibits no discharge.  Neck: Neck supple.  Cardiovascular: Normal rate, regular rhythm and intact distal pulses.  Pulmonary/Chest: Effort normal and breath sounds normal. No respiratory distress. She has no wheezes. She has no rales.  Abdominal: Soft. Bowel sounds are normal. She exhibits no distension. There is no abdominal tenderness. There is no guarding.  Neurological:  Awake and alert Oriented to self only (baseline)  Skin: Skin is warm and dry. She is not diaphoretic.  Right lower extremity appears erythematous and warm to touch.  Petechial rash noted on the left lower extremity.  Please see image.       Labs on Admission: I have personally reviewed following labs and imaging studies  CBC: Recent Labs  Lab 12/05/18 1956  WBC 8.1  NEUTROABS 5.2  HGB 13.3  HCT 43.1  MCV 92.5  PLT 219   Basic Metabolic Panel: Recent Labs  Lab 12/05/18 1628  NA 135  K 5.2*  CL 104  CO2 22  GLUCOSE 109*  BUN 26*  CREATININE 1.04*  CALCIUM 8.8*   GFR: CrCl cannot be calculated (Unknown ideal weight.). Liver Function Tests: Recent Labs  Lab 12/05/18 1628  AST 25  ALT 21  ALKPHOS 93  BILITOT 0.3  PROT 7.1  ALBUMIN 3.8   No results for  input(s): LIPASE, AMYLASE in the last 168 hours. No results for input(s): AMMONIA in the last 168 hours. Coagulation Profile: No results for input(s): INR, PROTIME in the last 168 hours. Cardiac Enzymes: No results for input(s): CKTOTAL, CKMB, CKMBINDEX, TROPONINI in the last 168 hours. BNP (last 3 results) No results for input(s): PROBNP in the last 8760 hours. HbA1C: No results for input(s): HGBA1C in the last 72 hours. CBG: No results for input(s): GLUCAP in the last 168 hours. Lipid Profile: No results for input(s): CHOL, HDL, LDLCALC, TRIG, CHOLHDL, LDLDIRECT in the last 72 hours. Thyroid Function Tests: No results for input(s):  TSH, T4TOTAL, FREET4, T3FREE, THYROIDAB in the last 72 hours. Anemia Panel: No results for input(s): VITAMINB12, FOLATE, FERRITIN, TIBC, IRON, RETICCTPCT in the last 72 hours. Urine analysis:    Component Value Date/Time   COLORURINE YELLOW 12/02/2016 1235   APPEARANCEUR CLOUDY (A) 12/02/2016 1235   LABSPEC 1.018 12/02/2016 1235   PHURINE 6.5 12/02/2016 1235   GLUCOSEU NEGATIVE 12/02/2016 1235   HGBUR NEGATIVE 12/02/2016 1235   BILIRUBINUR NEGATIVE 12/02/2016 1235   KETONESUR NEGATIVE 12/02/2016 1235   PROTEINUR 30 (A) 12/02/2016 1235   NITRITE NEGATIVE 12/02/2016 1235   LEUKOCYTESUR NEGATIVE 12/02/2016 1235    Radiological Exams on Admission: No results found.  Assessment/Plan Active Problems:   Cellulitis   Right lower extremity cellulitis -Right lower extremity erythematous and warm to touch.  Please see image.  DVT less likely as patient is currently on full dose anticoagulation with Eliquis. -Patient failed outpatient antibiotic therapy with Bactrim and doxycycline.   -Afebrile and no leukocytosis.  Lactic acid x2 normal.  No tachycardia or tachypnea.  Blood pressure soft on arrival, subsequently improved after IV fluid bolus. -Received a dose of clindamycin in the ED.  Penicillin allergy low risk (yeast infection more than 10 years  ago).  Will switch to cefazolin due to higher risk of C. difficile with clindamycin. -Blood culture x2 pending -Petechial rash noted on the left lower extremity.  No cytopenias on CBC.  Renal function at baseline.  LFTs normal.  Continue to monitor closely, if no improvement, patient will need work-up for possible vasculitis.  Urinary frequency Son states patient has been urinating a lot.  Afebrile and no leukocytosis. -Check UA to rule out possible UTI  Borderline hyperkalemia Potassium 5.2. -IV fluid hydration -Continue to monitor BMP  CKD 3 -Stable.  Creatinine 1.0, at baseline.  Hypertension -Blood pressure soft on arrival, improved with IV fluid bolus. -Hold home antihypertensives at this time  Paroxysmal atrial fibrillation -Currently rate controlled.  Hold home Coreg and Cartia in the setting of soft blood pressure.  Continue Eliquis for anticoagulation.  Dementia -Continue home Aricept, Namenda  DVT prophylaxis: Eliquis Code Status: Full code.  Discussed with son at bedside. Family Communication: Son and grandson at bedside. Disposition Plan: Anticipate discharge after clinical improvement. Consults called: None Admission status: Observation  John Giovanni MD [facility] Pager [phone]  If 7PM-7AM, please contact night-coverage [email or web address] Password [facility]  12/05/2018, 8:49 PM

## 2018-12-05 NOTE — ED Provider Notes (Signed)
[facility]-EMERGENCY DEPT Provider Note   CSN: [id number] Arrival date & time: 12/05/18  1607    History   Chief Complaint Chief Complaint  Patient presents with  . leg infection  . Urinary Frequency    HPI Savannah Hinton is a 84 y.o. female.     Pt presents to the ED today with bilateral leg redness not responding to oral abx.  Pt was on a week of bactrim, then was put on doxy 2 days ago.  Redness started on the red leg and has now moved to the left.  Pt has urinary frequency, but that seems to be a chronic issue.  The pt denies fevers.  Her family said her bp has been in the 90s in the morning.     Past Medical History:  Diagnosis Date  . Allergy   . Atrial fibrillation (HCC)   . Dementia (HCC)   . Glaucoma   . Hyperlipidemia   . Hypertension   . Memory loss   . Vertigo     Patient Active Problem List   Diagnosis Date Noted  . Cellulitis 12/05/2018  . Acute diastolic CHF (congestive heart failure) (HCC) 12/05/2016  . Alzheimer's dementia- severely confused without behavioral disturbances 12/05/2016  . Cardiomegaly 12/02/2016  . Vertigo 05/31/2016  . A-fib (HCC) 02/24/2014  . HTN (hypertension) 02/24/2014  . Chronic venous insufficiency 02/24/2014  . Chronic anticoagulation 02/24/2014  . Memory loss   . Cognitive impairment 07/11/2013  . Chronic cholecystitis with calculus 06/16/2011  . INFECTIOUS DIARRHEA 10/07/2009  . DIARRHEA-PRESUMED INFECTIOUS 10/07/2009  . HYPERLIPIDEMIA 10/07/2009  . Essential hypertension 10/07/2009  . GERD 10/07/2009  . DIVERTICULOSIS-COLON 10/07/2009  . DIVERTICULITIS OF COLON 10/07/2009  . RECTAL BLEEDING 10/07/2009  . NAUSEA 10/07/2009    Past Surgical History:  Procedure Laterality Date  . APPENDECTOMY  1974  . CHOLECYSTECTOMY  2012  . RETINAL DETACHMENT SURGERY       OB History   No obstetric history on file.      Home Medications    Prior to Admission medications   Medication Sig  Start Date End Date Taking? Authorizing Provider  CARTIA XT 120 MG 24 hr capsule Take 120 mg by mouth daily.  11/28/17  Yes [provider]  carvedilol (COREG) 3.125 MG tablet Take 3.125 mg by mouth 2 (two) times daily with a meal.  11/28/17  Yes [provider]  cetirizine (ZYRTEC) 10 MG tablet Take 10 mg by mouth daily.   Yes [provider]  Cyanocobalamin (VITAMIN B 12 PO) Take 1 tablet by mouth daily.    Yes [provider]  donepezil (ARICEPT) 10 MG tablet Take 1 tablet (10 mg total) by mouth 2 (two) times daily. 06/05/18  Yes Nilda Riggs, NP  doxycycline (VIBRAMYCIN) 100 MG capsule Take 100 mg by mouth 2 (two) times daily.   Yes [provider]  ELIQUIS 5 MG TABS tablet TAKE 1 TABLET(5 MG) BY MOUTH TWICE DAILY Patient taking differently: Take 5 mg by mouth 2 (two) times daily.  03/27/17  Yes Jake Bathe, MD  fexofenadine (ALLEGRA) 180 MG tablet Take 180 mg by mouth daily.   Yes [provider]  fluconazole (DIFLUCAN) 150 MG tablet Take 150 mg by mouth every other day.   Yes [provider]  glycopyrrolate (ROBINUL) 1 MG tablet Take 0.5-1 mg by mouth 3 (three) times daily.   Yes [provider]  loperamide (IMODIUM) 2 MG capsule Take by mouth  as needed for diarrhea or loose stools.   Yes [provider]  memantine (NAMENDA) 10 MG tablet Take 1 tablet (10 mg total) by mouth 2 (two) times daily. 06/05/18  Yes Nilda Riggs, NP  sodium chloride (OCEAN) 0.65 % SOLN nasal spray Place 1 spray into both nostrils as needed for congestion.   Yes [provider]  timolol (TIMOPTIC) 0.5 % ophthalmic solution Place 1 drop into both eyes 2 (two) times daily.  06/02/11  Yes [provider]  sulfamethoxazole-trimethoprim (BACTRIM DS,SEPTRA DS) 800-160 MG tablet Take 1 tablet by mouth 2 (two) times daily.    [provider]    Family History Family History  Problem Relation Age of Onset   . Stroke Mother   . Stroke Father   . Cancer Sister        breast  . Heart attack Paternal Uncle     Social History Social History   Tobacco Use  . Smoking status: Never Smoker  . Smokeless tobacco: Never Used  Substance Use Topics  . Alcohol use: No    Alcohol/week: 0.0 standard drinks  . Drug use: No     Allergies   Codeine and Penicillins   Review of Systems Review of Systems  Skin: Positive for rash.  All other systems reviewed and are negative.    Physical Exam Updated Vital Signs BP 133/89   Pulse 77   Temp 97.7 F (36.5 C) (Oral)   Resp 15   SpO2 93%   Physical Exam Vitals signs and nursing note reviewed.  Constitutional:      Appearance: Normal appearance.  HENT:     Head: Normocephalic and atraumatic.     Right Ear: External ear normal.     Left Ear: External ear normal.     Nose: Nose normal.     Mouth/Throat:     Mouth: Mucous membranes are moist.  Eyes:     Extraocular Movements: Extraocular movements intact.     Pupils: Pupils are equal, round, and reactive to light.  Neck:     Musculoskeletal: Normal range of motion and neck supple.  Cardiovascular:     Rate and Rhythm: Normal rate and regular rhythm.     Pulses: Normal pulses.     Heart sounds: Normal heart sounds.  Pulmonary:     Effort: Pulmonary effort is normal.     Breath sounds: Normal breath sounds.  Abdominal:     General: Abdomen is flat.  Musculoskeletal: Normal range of motion.  Skin:    General: Skin is warm.     Capillary Refill: Capillary refill takes less than 2 seconds.     Comments: Bilateral LE cellulitis, right > left  Neurological:     Mental Status: She is alert. Mental status is at baseline.  Psychiatric:        Mood and Affect: Mood normal.        Behavior: Behavior normal.      ED Treatments / Results  Labs (all labs ordered are listed, but only abnormal results are displayed) Labs Reviewed  COMPREHENSIVE METABOLIC PANEL - Abnormal; Notable for  the following components:      Result Value   Potassium 5.2 (*)    Glucose, Bld 109 (*)    BUN 26 (*)    Creatinine, Ser 1.04 (*)    Calcium 8.8 (*)    GFR calc non Af Amer 50 (*)    GFR calc Af Amer 58 (*)  All other components within normal limits  CULTURE, BLOOD (ROUTINE X 2)  CULTURE, BLOOD (ROUTINE X 2)  LACTIC ACID, PLASMA  LACTIC ACID, PLASMA  CBC WITH DIFFERENTIAL/PLATELET  CBC WITH DIFFERENTIAL/PLATELET  URINALYSIS, ROUTINE W REFLEX MICROSCOPIC    EKG None  Radiology No results found.  Procedures Procedures (including critical care time)  Medications Ordered in ED Medications  sodium chloride 0.9 % bolus 1,000 mL (1,000 mLs Intravenous New Bag/Given 12/05/18 1941)    And  0.9 %  sodium chloride infusion (has no administration in time range)  clindamycin (CLEOCIN) IVPB 600 mg (600 mg Intravenous New Bag/Given 12/05/18 1947)     Initial Impression / Assessment and Plan / ED Course  I have reviewed the triage vital signs and the nursing notes.  Pertinent labs & imaging results that were available during my care of the patient were reviewed by me and considered in my medical decision making (see chart for details).     Pt has been on 2 antibiotics without improvement in sx.  Cellulitis is worsening.  Pt given IV clinda in ED.  Pt d/w Dr. Loney Loh ([facility]) who will admit.  Final Clinical Impressions(s) / ED Diagnoses   Final diagnoses:  Cellulitis of right lower extremity  Cellulitis of left lower extremity  Failure of outpatient treatment    ED Discharge Orders    None       Jacalyn Lefevre, MD 12/05/18 2032

## 2018-12-06 ENCOUNTER — Other Ambulatory Visit: Payer: Self-pay

## 2018-12-06 ENCOUNTER — Ambulatory Visit: Payer: Medicare Other | Admitting: Neurology

## 2018-12-06 ENCOUNTER — Observation Stay (HOSPITAL_COMMUNITY): Payer: Medicare Other

## 2018-12-06 DIAGNOSIS — Z8249 Family history of ischemic heart disease and other diseases of the circulatory system: Secondary | ICD-10-CM | POA: Diagnosis not present

## 2018-12-06 DIAGNOSIS — W228XXA Striking against or struck by other objects, initial encounter: Secondary | ICD-10-CM | POA: Diagnosis present

## 2018-12-06 DIAGNOSIS — N183 Chronic kidney disease, stage 3 (moderate): Secondary | ICD-10-CM

## 2018-12-06 DIAGNOSIS — Z880 Allergy status to penicillin: Secondary | ICD-10-CM | POA: Diagnosis not present

## 2018-12-06 DIAGNOSIS — Z79899 Other long term (current) drug therapy: Secondary | ICD-10-CM | POA: Diagnosis not present

## 2018-12-06 DIAGNOSIS — F0390 Unspecified dementia without behavioral disturbance: Secondary | ICD-10-CM

## 2018-12-06 DIAGNOSIS — L03116 Cellulitis of left lower limb: Secondary | ICD-10-CM | POA: Diagnosis present

## 2018-12-06 DIAGNOSIS — R1310 Dysphagia, unspecified: Secondary | ICD-10-CM | POA: Diagnosis present

## 2018-12-06 DIAGNOSIS — Z803 Family history of malignant neoplasm of breast: Secondary | ICD-10-CM | POA: Diagnosis not present

## 2018-12-06 DIAGNOSIS — Z823 Family history of stroke: Secondary | ICD-10-CM | POA: Diagnosis not present

## 2018-12-06 DIAGNOSIS — Z515 Encounter for palliative care: Secondary | ICD-10-CM | POA: Diagnosis present

## 2018-12-06 DIAGNOSIS — Z9049 Acquired absence of other specified parts of digestive tract: Secondary | ICD-10-CM | POA: Diagnosis not present

## 2018-12-06 DIAGNOSIS — S8011XA Contusion of right lower leg, initial encounter: Secondary | ICD-10-CM | POA: Diagnosis present

## 2018-12-06 DIAGNOSIS — I480 Paroxysmal atrial fibrillation: Secondary | ICD-10-CM | POA: Diagnosis present

## 2018-12-06 DIAGNOSIS — K529 Noninfective gastroenteritis and colitis, unspecified: Secondary | ICD-10-CM | POA: Diagnosis present

## 2018-12-06 DIAGNOSIS — I10 Essential (primary) hypertension: Secondary | ICD-10-CM

## 2018-12-06 DIAGNOSIS — Y929 Unspecified place or not applicable: Secondary | ICD-10-CM | POA: Diagnosis not present

## 2018-12-06 DIAGNOSIS — Z7189 Other specified counseling: Secondary | ICD-10-CM | POA: Diagnosis not present

## 2018-12-06 DIAGNOSIS — L03115 Cellulitis of right lower limb: Secondary | ICD-10-CM | POA: Diagnosis present

## 2018-12-06 DIAGNOSIS — R350 Frequency of micturition: Secondary | ICD-10-CM | POA: Diagnosis present

## 2018-12-06 DIAGNOSIS — E639 Nutritional deficiency, unspecified: Secondary | ICD-10-CM | POA: Diagnosis present

## 2018-12-06 DIAGNOSIS — R233 Spontaneous ecchymoses: Secondary | ICD-10-CM | POA: Diagnosis present

## 2018-12-06 DIAGNOSIS — E875 Hyperkalemia: Secondary | ICD-10-CM | POA: Diagnosis present

## 2018-12-06 DIAGNOSIS — Z7901 Long term (current) use of anticoagulants: Secondary | ICD-10-CM | POA: Diagnosis not present

## 2018-12-06 DIAGNOSIS — I1310 Hypertensive heart and chronic kidney disease without heart failure, with stage 1 through stage 4 chronic kidney disease, or unspecified chronic kidney disease: Secondary | ICD-10-CM | POA: Diagnosis present

## 2018-12-06 DIAGNOSIS — E785 Hyperlipidemia, unspecified: Secondary | ICD-10-CM | POA: Diagnosis present

## 2018-12-06 DIAGNOSIS — R5381 Other malaise: Secondary | ICD-10-CM | POA: Diagnosis present

## 2018-12-06 LAB — BASIC METABOLIC PANEL
Anion gap: 11 (ref 5–15)
BUN: 22 mg/dL (ref 8–23)
CHLORIDE: 106 mmol/L (ref 98–111)
CO2: 24 mmol/L (ref 22–32)
Calcium: 8.7 mg/dL — ABNORMAL LOW (ref 8.9–10.3)
Creatinine, Ser: 0.86 mg/dL (ref 0.44–1.00)
GFR calc Af Amer: >60 mL/min (ref >60)
GFR calc non Af Amer: >60 mL/min (ref >60)
Glucose, Bld: 93 mg/dL (ref 70–99)
Potassium: 4.1 mmol/L (ref 3.5–5.1)
SODIUM: 141 mmol/L (ref 135–145)

## 2018-12-06 MED ORDER — IOHEXOL 300 MG/ML  SOLN
100.0000 mL | Freq: Once | INTRAMUSCULAR | Status: AC | PRN
  Administered 2018-12-06: 100 mL via INTRAVENOUS

## 2018-12-06 MED ORDER — SODIUM CHLORIDE (PF) 0.9 % IJ SOLN
INTRAMUSCULAR | Status: AC
  Filled 2018-12-06: qty 50

## 2018-12-06 MED ORDER — MELATONIN 3 MG PO TABS
3.0000 mg | ORAL_TABLET | Freq: Every evening | ORAL | Status: DC | PRN
  Administered 2018-12-06: 3 mg via ORAL
  Filled 2018-12-06 (×2): qty 1

## 2018-12-06 MED ORDER — METOPROLOL TARTRATE 12.5 MG HALF TABLET
12.5000 mg | ORAL_TABLET | Freq: Two times a day (BID) | ORAL | Status: DC
  Administered 2018-12-06 – 2018-12-10 (×8): 12.5 mg via ORAL
  Filled 2018-12-06 (×8): qty 1

## 2018-12-06 NOTE — Progress Notes (Signed)
PT Cancellation Note  Patient Details Name: Savannah Hinton MRN: [id number] DOB: [birth year]   Cancelled Treatment:    Reason Eval/Treat Not Completed: Patient at procedure or test/unavailable(transporter in room to take pt for CT)   Lelah Rennaker,KATHrine E 12/06/2018, 2:28 PM Zenovia Jarred, PT, DPT Acute Rehabilitation Services Office: [phone] Pager: [phone]

## 2018-12-06 NOTE — Progress Notes (Signed)
PROGRESS NOTE    ELVIN BELLVILLE  OTR:[id number] DOB: July 10, 1935 DOA: 12/05/2018 PCP: Koren Shiver, DO    Brief Narrative:   Savannah Hinton is a 84 y.o. female with medical history significant of dementia, A. fib, hypertension, hyperlipidemia presenting to the hospital for evaluation of right lower extremity redness. Son at bedside is patient's caregiver.  States patient has dementia and is oriented to self only at baseline.  States he noticed that she had an area of redness on her right lower leg about 2 weeks ago and the area has progressively been expanding in size.  Patient was previously treated with Bactrim for a week with no improvement.  She was recently started on doxycycline but again no improvement noticed.  He does not think patient has had any fevers, chills, nausea, vomiting, or diarrhea.  Does mention that she has been urinating a lot.  In addition, son is concerned that her blood pressure was low today.  Home readings with systolic in the 90s and diastolic in the 50s.  Son states patient is currently on 2 different blood pressure medications.   Assessment & Plan:   Principal Problem:   Cellulitis Active Problems:   HTN (hypertension)   Urinary frequency   Hyperkalemia   CKD (chronic kidney disease) stage 3, GFR 30-59 ml/min (HCC)  Right lower extremity cellulitis Patient presenting with progressive erythema and warmth to right lower extremity onset roughly 2 weeks ago.  Previously on Bactrim x7 days, followed by doxycycline without any improvement.  Patient is afebrile without leukocytosis with normal lactic acid of 0.9. --Concern for possible abscess formation, will obtain CT right tib-fib with contrast for further evaluation --If abscess noted on CT, will consult general surgery for possible I&D --Continue IV antibiotics with cefazolin  CKD stage III Stable.  Creatinine 1.0, at baseline.  Creatinine 0.86 on admission. --Renally dose all medications, avoid  nephrotoxins, closely monitor renal function daily  Hypertension Blood pressure soft on arrival, improved with IV fluid bolus. --Hold home Cartia and Coreg --Starting low-dose metoprolol as below for rate control of A. fib  Paroxysmal atrial fibrillation Currently rate controlled.  Hold home Coreg and Cartia in the setting of soft blood pressure.   --Continue Eliquis for anticoagulation. --start low-dose metoprolol 12.5 mg p.o. twice daily for rate control as has small effect on BP  Dementia --Continue home Aricept, Namenda  Nutritional deficiency Patient family reports decreased appetite over the last few months, concern for progression of underlying dementia. --Nutrition consultation for evaluation and further recommendations  Debility/deconditioning --Eval for discharge needs   DVT prophylaxis: Eliquis Code Status: Full code Family Communication: Husband and son at bedside Disposition Plan: Continue inpatient level of care, failed outpatient antibiotic therapy, CT for evaluation of possible abscess, further depending on clinical course  Consultants:   None  Procedures:   None  Antimicrobials:   Clindamycin 3/11-3/11  Cefazolin 3/11>>>   Subjective: Patient seen and examined at bedside, family including husband and son present.  Patient with underlying dementia and unable to communicate clearly.  Family states has been experiencing erythema and swelling to right lower extremity for 2 weeks despite antibiotic therapy with Bactrim and doxycycline.  Family also reports decreased appetite over the last few months with some constipation.  On exam concern for fluctuance with possible abscess discussed need for CT of her lower extremity for further evaluation.  Patient and her family members without any other complaints at this time.  No acute concerns overnight per nursing  staff.  Objective: Vitals:   12/05/18 1903 12/05/18 2154 12/06/18 0614 12/06/18 1347  BP: 133/89  (!) 145/73 126/77 116/66  Pulse: 77 93 83 (!) 105  Resp: 15 15 16 16   Temp:  98 F (36.7 C) 98.6 F (37 C) 98.7 F (37.1 C)  TempSrc:  Oral Oral Oral  SpO2: 93% 100% 98% 94%    Intake/Output Summary (Last 24 hours) at 12/06/2018 1551 Last data filed at 12/06/2018 1500 Gross per 24 hour  Intake 2730.06 ml  Output 750 ml  Net 1980.06 ml   There were no vitals filed for this visit.  Examination:  General exam: Appears calm and comfortable  Respiratory system: Clear to auscultation. Respiratory effort normal. Cardiovascular system: S1 & S2 heard, RRR. No JVD, murmurs, rubs, gallops or clicks. No pedal edema. Gastrointestinal system: Abdomen is nondistended, soft and nontender. No organomegaly or masses felt. Normal bowel sounds heard. Central nervous system: Alert and oriented. No focal neurological deficits. Extremities: Symmetric 5 x 5 power. Skin: No rashes, lesions or ulcers Psychiatry: Judgement and insight appear normal. Mood & affect appropriate.     Data Reviewed: I have personally reviewed following labs and imaging studies  CBC: Recent Labs  Lab 12/05/18 1956  WBC 8.1  NEUTROABS 5.2  HGB 13.3  HCT 43.1  MCV 92.5  PLT 219   Basic Metabolic Panel: Recent Labs  Lab 12/05/18 1628 12/06/18 0448  NA 135 141  K 5.2* 4.1  CL 104 106  CO2 22 24  GLUCOSE 109* 93  BUN 26* 22  CREATININE 1.04* 0.86  CALCIUM 8.8* 8.7*   GFR: CrCl cannot be calculated (Unknown ideal weight.). Liver Function Tests: Recent Labs  Lab 12/05/18 1628  AST 25  ALT 21  ALKPHOS 93  BILITOT 0.3  PROT 7.1  ALBUMIN 3.8   No results for input(s): LIPASE, AMYLASE in the last 168 hours. No results for input(s): AMMONIA in the last 168 hours. Coagulation Profile: No results for input(s): INR, PROTIME in the last 168 hours. Cardiac Enzymes: No results for input(s): CKTOTAL, CKMB, CKMBINDEX, TROPONINI in the last 168 hours. BNP (last 3 results) No results for input(s): PROBNP in  the last 8760 hours. HbA1C: No results for input(s): HGBA1C in the last 72 hours. CBG: No results for input(s): GLUCAP in the last 168 hours. Lipid Profile: No results for input(s): CHOL, HDL, LDLCALC, TRIG, CHOLHDL, LDLDIRECT in the last 72 hours. Thyroid Function Tests: No results for input(s): TSH, T4TOTAL, FREET4, T3FREE, THYROIDAB in the last 72 hours. Anemia Panel: No results for input(s): VITAMINB12, FOLATE, FERRITIN, TIBC, IRON, RETICCTPCT in the last 72 hours. Sepsis Labs: Recent Labs  Lab 12/05/18 1628 12/05/18 1956  LATICACIDVEN 1.2 0.9    Recent Results (from the past 240 hour(s))  Blood culture (routine x 2)     Status: None (Preliminary result)   Collection Time: 12/05/18  7:56 PM  Result Value Ref Range Status   Specimen Description   Final    BLOOD LEFT ANTECUBITAL Performed at [facility], [address], [place], [place] [address]    Special Requests   Final    BOTTLES DRAWN AEROBIC AND ANAEROBIC Blood Culture adequate volume Performed at [facility], [address], [place], [place] [address]    Culture   Final    NO GROWTH < 24 HOURS Performed at [facility], [address] [place], [place] [address]    Report Status PENDING  Incomplete  Blood  culture (routine x 2)     Status: None (Preliminary result)   Collection Time: 12/05/18  7:56 PM  Result Value Ref Range Status   Specimen Description   Final    BLOOD LEFT HAND Performed at [facility] [address], [place], [place] [address]    Special Requests   Final    BOTTLES DRAWN AEROBIC ONLY Blood Culture adequate volume Performed at [facility], [address], [place], [place] [address]    Culture   Final    NO GROWTH < 24 HOURS Performed at [facility], [address], [place], [place] [address]    Report Status PENDING  Incomplete         Radiology Studies: No results found.       Scheduled Meds: . apixaban  5 mg Oral BID  . donepezil  10 mg Oral BID  . loratadine  10 mg Oral Daily  . memantine  10 mg Oral BID  . sodium chloride (PF)      . timolol  1 drop Both Eyes BID   Continuous Infusions: . sodium chloride 125 mL/hr at 12/06/18 1528  .  ceFAZolin (ANCEF) IV 1 g (12/06/18 1358)     LOS: 0 days    Time spent: 29 minutes    Alvira Philips [place], DO [facility] Pager [phone]  If 7PM-7AM, please contact night-coverage [email or web address] Password [facility] 12/06/2018, 3:51 PM

## 2018-12-07 DIAGNOSIS — R350 Frequency of micturition: Secondary | ICD-10-CM

## 2018-12-07 DIAGNOSIS — E875 Hyperkalemia: Secondary | ICD-10-CM

## 2018-12-07 LAB — BASIC METABOLIC PANEL WITH GFR
Anion gap: 8 (ref 5–15)
BUN: 15 mg/dL (ref 8–23)
CO2: 24 mmol/L (ref 22–32)
Calcium: 8.1 mg/dL — ABNORMAL LOW (ref 8.9–10.3)
Chloride: 107 mmol/L (ref 98–111)
Creatinine, Ser: 0.77 mg/dL (ref 0.44–1.00)
GFR calc Af Amer: >60 mL/min
GFR calc non Af Amer: >60 mL/min
Glucose, Bld: 103 mg/dL — ABNORMAL HIGH (ref 70–99)
Potassium: 4 mmol/L (ref 3.5–5.1)
Sodium: 139 mmol/L (ref 135–145)

## 2018-12-07 LAB — CBC
HCT: 39.3 % (ref 36.0–46.0)
Hemoglobin: 11.9 g/dL — ABNORMAL LOW (ref 12.0–15.0)
MCH: 28.3 pg (ref 26.0–34.0)
MCHC: 30.3 g/dL (ref 30.0–36.0)
MCV: 93.3 fL (ref 80.0–100.0)
Platelets: 198 10*3/uL (ref 150–400)
RBC: 4.21 MIL/uL (ref 3.87–5.11)
RDW: 14.4 % (ref 11.5–15.5)
WBC: 8 10*3/uL (ref 4.0–10.5)
nRBC: 0 % (ref 0.0–0.2)

## 2018-12-07 MED ORDER — MIRTAZAPINE 15 MG PO TABS
7.5000 mg | ORAL_TABLET | Freq: Every day | ORAL | Status: DC
  Administered 2018-12-07 – 2018-12-09 (×3): 7.5 mg via ORAL
  Filled 2018-12-07 (×3): qty 1

## 2018-12-07 MED ORDER — ENSURE ENLIVE PO LIQD
237.0000 mL | Freq: Two times a day (BID) | ORAL | Status: DC
  Administered 2018-12-08 – 2018-12-10 (×5): 237 mL via ORAL

## 2018-12-07 NOTE — Progress Notes (Addendum)
PROGRESS NOTE    [name]  VXY:[id number] DOB: 20-Sep-1935 DOA: 12/05/2018 PCP: Koren Shiver, DO    Brief Narrative:   Savannah Hinton is a 84 y.o. female with medical history significant of dementia, A. fib, hypertension, hyperlipidemia presenting to the hospital for evaluation of right lower extremity redness. Son at bedside is patient's caregiver.  States patient has dementia and is oriented to self only at baseline.  States he noticed that she had an area of redness on her right lower leg about 2 weeks ago and the area has progressively been expanding in size.  Patient was previously treated with Bactrim for a week with no improvement.  She was recently started on doxycycline but again no improvement noticed.  He does not think patient has had any fevers, chills, nausea, vomiting, or diarrhea.  Does mention that she has been urinating a lot.  In addition, son is concerned that her blood pressure was low today.  Home readings with systolic in the 90s and diastolic in the 50s.  Son states patient is currently on 2 different blood pressure medications.   Assessment & Plan:   Principal Problem:   Cellulitis Active Problems:   HTN (hypertension)   Urinary frequency   Hyperkalemia   CKD (chronic kidney disease) stage 3, GFR 30-59 ml/min (HCC)  Right lower extremity cellulitis Patient presenting with progressive erythema and warmth to right lower extremity onset roughly 2 weeks ago.  Previously on Bactrim x7 days, followed by doxycycline without any improvement.  Patient is afebrile without leukocytosis with normal lactic acid of 0.9. --CT R tib/fib w/ contrast 3/12 w/ 1.3 x 3.1 x 4.1 collection likely hematoma without enhancement or gas unlikely abscess formation --Continue IV antibiotics with cefazolin  CKD stage III Stable.  Creatinine 1.0, at baseline.  Creatinine 0.86 on admission. --Renally dose all medications, avoid nephrotoxins, closely monitor renal function daily   Hypertension Blood pressure soft on arrival, improved with IV fluid bolus. --Hold home Cartia and Coreg --Starting low-dose metoprolol as below for rate control of A. fib  Paroxysmal atrial fibrillation Currently rate controlled.  Hold home Coreg and Cartia in the setting of soft blood pressure.   --Continue Eliquis for anticoagulation. --start low-dose metoprolol 12.5 mg p.o. twice daily for rate control as has small effect on BP  Dementia --Continue home Aricept, Namenda --Dementia seems to be progressing, may need further discussions with PCP regarding course and aggressive measures moving forward.  May be beneficial to start palliative care talks outpatient.  Nutritional deficiency Patient family reports decreased appetite over the last few months, concern for progression of underlying dementia. --Nutrition consultation for evaluation and further recommendations  Debility/deconditioning --PT eval for discharge needs   DVT prophylaxis: Eliquis Code Status: Full code Family Communication: Husband and son at bedside Disposition Plan: Continue inpatient level of care, failed outpatient antibiotic therapy, anticipate discharge home in 1-2 days Consultants:   None  Procedures:   None  Antimicrobials:   Clindamycin 3/11-3/11  Cefazolin 3/11>>>   Subjective: Patient seen and examined at bedside, family including husband and son present.  Patient with underlying dementia and unable to communicate clearly.  Erythema/cellulitis to right lower extremity improving.  CT notable for likely hematoma rather than abscess of right lower extremity.  Awaiting PT evaluation for discharge needs. Patient and her family members without any other complaints at this time.  No acute concerns overnight per nursing staff.  Objective: Vitals:   12/06/18 2217 12/07/18 0601 12/07/18 1044 12/07/18  1051  BP: 91/72 92/65 (!) 143/79   Pulse: 77 77 75   Resp: 18 16    Temp: 98.5 F (36.9 C)  98.5 F (36.9 C)    TempSrc: Oral Oral    SpO2: 91% 92%    Weight:    74.7 kg    Intake/Output Summary (Last 24 hours) at 12/07/2018 1222 Last data filed at 12/07/2018 1028 Gross per 24 hour  Intake 3222.2 ml  Output 700 ml  Net 2522.2 ml   Filed Weights   12/07/18 1051  Weight: 74.7 kg    Examination:  General exam: Appears calm and comfortable  Respiratory system: Clear to auscultation. Respiratory effort normal. Cardiovascular system: S1 & S2 heard, RRR. No JVD, murmurs, rubs, gallops or clicks. No pedal edema. Gastrointestinal system: Abdomen is nondistended, soft and nontender. No organomegaly or masses felt. Normal bowel sounds heard. Central nervous system: Alert and oriented. No focal neurological deficits. Extremities: Symmetric 5 x 5 power. Skin: No rashes, lesions or ulcers Psychiatry: Judgement and insight appear normal. Mood & affect appropriate.     Data Reviewed: I have personally reviewed following labs and imaging studies  CBC: Recent Labs  Lab 12/05/18 1956 12/07/18 0713  WBC 8.1 8.0  NEUTROABS 5.2  --   HGB 13.3 11.9*  HCT 43.1 39.3  MCV 92.5 93.3  PLT 219 198   Basic Metabolic Panel: Recent Labs  Lab 12/05/18 1628 12/06/18 0448 12/07/18 0713  NA 135 141 139  K 5.2* 4.1 4.0  CL 104 106 107  CO2 22 24 24   GLUCOSE 109* 93 103*  BUN 26* 22 15  CREATININE 1.04* 0.86 0.77  CALCIUM 8.8* 8.7* 8.1*   GFR: CrCl cannot be calculated (Unknown ideal weight.). Liver Function Tests: Recent Labs  Lab 12/05/18 1628  AST 25  ALT 21  ALKPHOS 93  BILITOT 0.3  PROT 7.1  ALBUMIN 3.8   No results for input(s): LIPASE, AMYLASE in the last 168 hours. No results for input(s): AMMONIA in the last 168 hours. Coagulation Profile: No results for input(s): INR, PROTIME in the last 168 hours. Cardiac Enzymes: No results for input(s): CKTOTAL, CKMB, CKMBINDEX, TROPONINI in the last 168 hours. BNP (last 3 results) No results for input(s): PROBNP in the  last 8760 hours. HbA1C: No results for input(s): HGBA1C in the last 72 hours. CBG: No results for input(s): GLUCAP in the last 168 hours. Lipid Profile: No results for input(s): CHOL, HDL, LDLCALC, TRIG, CHOLHDL, LDLDIRECT in the last 72 hours. Thyroid Function Tests: No results for input(s): TSH, T4TOTAL, FREET4, T3FREE, THYROIDAB in the last 72 hours. Anemia Panel: No results for input(s): VITAMINB12, FOLATE, FERRITIN, TIBC, IRON, RETICCTPCT in the last 72 hours. Sepsis Labs: Recent Labs  Lab 12/05/18 1628 12/05/18 1956  LATICACIDVEN 1.2 0.9    Recent Results (from the past 240 hour(s))  Blood culture (routine x 2)     Status: None (Preliminary result)   Collection Time: 12/05/18  7:56 PM  Result Value Ref Range Status   Specimen Description   Final    BLOOD LEFT ANTECUBITAL Performed at [facility], [address] [place], [place] [address]    Special Requests   Final    BOTTLES DRAWN AEROBIC AND ANAEROBIC Blood Culture adequate volume Performed at [facility], [address] [place], [place] [address]    Culture   Final    NO GROWTH < 24 HOURS Performed at [facility], [address], [place], [place] [address]    Report Status PENDING  Incomplete  Blood culture (routine x 2)     Status: None (Preliminary result)   Collection Time: 12/05/18  7:56 PM  Result Value Ref Range Status   Specimen Description   Final    BLOOD LEFT HAND Performed at [facility], [address], [place], [place] [address]    Special Requests   Final    BOTTLES DRAWN AEROBIC ONLY Blood Culture adequate volume Performed at [facility], [address], [place], [place] [address]    Culture   Final    NO GROWTH < 24 HOURS Performed at [facility], [address], [place], [place] [address]    Report Status PENDING  Incomplete         Radiology Studies: Ct Tibia Fibula Right W Contrast   Result Date: 12/06/2018 CLINICAL DATA:  Right lower extremity redness and pain for approximately 2 weeks. No known injury. EXAM: CT OF THE LOWER RIGHT EXTREMITY WITH CONTRAST TECHNIQUE: Multidetector CT imaging of the lower right extremity was performed according to the standard protocol following intravenous contrast administration. COMPARISON:  None. CONTRAST:  100 mL OMNIPAQUE IOHEXOL 300 MG/ML  SOLN FINDINGS: Bones/Joint/Cartilage No acute bony or joint abnormality is identified. No bony destructive change or periosteal reaction. Osteoarthritis about the knee is noted. Chondrocalcinosis of the menisci is seen. No worrisome bone lesion. Ligaments Suboptimally assessed by CT. Muscles and Tendons Intact. No atrophy or focal lesion. No intramuscular fluid collection. No gas within muscle or tracking along fascial planes. Soft tissues There is a focal area of infiltration of subcutaneous fat along the anterior aspect of the lower leg. The lesion is centered 14 cm below the top of the fibular head and measures 1.3 cm AP x 3.1 cm transverse x 4.1 cm craniocaudal. There is no rim enhancement or gas within the collection. Density units measurements are 59. Mild infiltration of subcutaneous fat about the distal aspect of the lower leg is noted. IMPRESSION: Focal subcutaneous collection in the anterior aspect of the lower leg is likely a hematoma but could represent phlegmon. The appearance is not typical for abscess. Negative for acute bony or joint abnormality. Osteoarthritis right knee.  Chondrocalcinosis right knee also noted. Electronically Signed   By: Drusilla Kanner M.D.   On: 12/06/2018 15:52        Scheduled Meds: . apixaban  5 mg Oral BID  . donepezil  10 mg Oral BID  . loratadine  10 mg Oral Daily  . memantine  10 mg Oral BID  . metoprolol tartrate  12.5 mg Oral BID  . timolol  1 drop Both Eyes BID   Continuous Infusions: . sodium chloride 125 mL/hr at 12/07/18 0516  .  ceFAZolin (ANCEF) IV 1  g (12/07/18 0517)     LOS: 1 day    Time spent: 27 minutes    Alvira Philips [place], DO [facility] Pager [phone]  If 7PM-7AM, please contact night-coverage [email or web address] Password [facility] 12/07/2018, 12:22 PM

## 2018-12-07 NOTE — Progress Notes (Signed)
PT Cancellation Note  Patient Details Name: Savannah Hinton MRN: [id number] DOB: August 04, 1935   Cancelled Treatment:    Reason Eval/Treat Not Completed: Fatigue/lethargy limiting ability to participate. Pt unable to participate at this time. Family requested PT check back another time. Will check back as schedule allows.    Rebeca Alert, PT Acute Rehabilitation Services Pager: [phone] Office: [phone]

## 2018-12-07 NOTE — Progress Notes (Signed)
Initial Nutrition Assessment  DOCUMENTATION CODES:   Not applicable  INTERVENTION:  Family considering Palliative support at time of visit, continue to monitor intake and offer ONS support  Ensure Enlive po BID, each supplement provides 350 kcal and 20 grams of protein (vanilla)  Magic cup TID with meals, each supplement provides 290 kcal and 9 grams of protein (vanilla)   NUTRITION DIAGNOSIS:   Inadequate oral intake related to chronic illness(dementia) as evidenced by meal completion < 25%, per patient/family report, energy intake < or equal to 50% for > or equal to 5 days.   GOAL:   Patient will meet greater than or equal to 90% of their needs   MONITOR:   PO intake, Weight trends, Supplement acceptance, Labs, Other (Comment)(Plan of Care; SLP consult)  REASON FOR ASSESSMENT:   Consult Assessment of nutrition requirement/status  ASSESSMENT:  84 year old female with medical history significant of dementia, A-fib, HTN, HLD, CKD III, GERD, Diverticulosis, Cholecystectomy admitted with cellulitis of lower rt leg s/p Bactrim and doxycycline with no improvement. Patient cared for by son and only oriented to self at baseline.  Per MD note, son reports decreased appetite over the past few months likely related to progression of dementia. No weights since admission, last documented weight 74.5kg taken 06/05/18.   Patient resting at visit, husband, son and daughter-in-law in room. Husband reports that patient has not been awake today and has noticeable declines in status. Husband stated that he feels she is getting close to the end. He reports that the family had been discussing Palliative Care support just before visit. Husband reports that patient is refusing medications and cried when they attempted to feed her last night and spit out the food. Son endorses poor PO over the past few months with sips and bites over the past few days. Family is concerned about swallowing difficulties  and expressed interest in having SLP evaluation. Explained that patient would need to be awake and more alert in order to participate. Informed RN of families concerns.   Husband reports that patient weighs approximately 160 lbs  Order for weight has been placed.   Patient is reported to drink vanilla ensure daily and family agreeable to Ensure and [facility] during admission. Son interested in ordering [facility] for home supplement, RD provided contact information for placing orders for pickup through the hospital.   Medications reviewed and include: Aricept, Namenda, Cefazolin  No pertinent labs NUTRITION - FOCUSED PHYSICAL EXAM:    Most Recent Value  Orbital Region  Mild depletion  Upper Arm Region  Moderate depletion  Thoracic and Lumbar Region  No depletion  Buccal Region  Moderate depletion  Temple Region  Moderate depletion  Clavicle Bone Region  Mild depletion  Clavicle and Acromion Bone Region  Mild depletion  Scapular Bone Region  Unable to assess  Dorsal Hand  Mild depletion  Patellar Region  Unable to assess  Anterior Thigh Region  Unable to assess  Posterior Calf Region  Unable to assess  Edema (RD Assessment)  Mild [BLE,  cellulitis]  Hair  Reviewed  Eyes  Unable to assess  Mouth  Unable to assess  Skin  Reviewed  Nails  Reviewed       Diet Order:   Diet Order            Diet Heart Room service appropriate? Yes; Fluid consistency: Thin  Diet effective now              EDUCATION NEEDS:  Not appropriate for education at this time  Skin:  Skin Assessment: Reviewed RN Assessment(intact; cellulitis; RLE)  Last BM:  3/12; type 2 (brown, large)  Height:   Ht Readings from Last 1 Encounters:  06/05/18 5\' 4"  (1.626 m)    Weight:   Wt Readings from Last 1 Encounters:  12/07/18 74.7 kg    Ideal Body Weight:  54.5 kg  BMI:  Body mass index is 28.27 kg/m.  Estimated Nutritional Needs:   Kcal:  1600-1800  Protein:  79-87 grams  Fluid:   >1.8L    Lars Masson, RD, LDN  After Hours/Weekend Pager: [phone]

## 2018-12-08 ENCOUNTER — Inpatient Hospital Stay (HOSPITAL_COMMUNITY): Payer: Medicare Other

## 2018-12-08 LAB — CBC
HCT: 43.2 % (ref 36.0–46.0)
Hemoglobin: 13 g/dL (ref 12.0–15.0)
MCH: 28.1 pg (ref 26.0–34.0)
MCHC: 30.1 g/dL (ref 30.0–36.0)
MCV: 93.5 fL (ref 80.0–100.0)
Platelets: 225 10*3/uL (ref 150–400)
RBC: 4.62 MIL/uL (ref 3.87–5.11)
RDW: 14.2 % (ref 11.5–15.5)
WBC: 7.7 10*3/uL (ref 4.0–10.5)
nRBC: 0 % (ref 0.0–0.2)

## 2018-12-08 LAB — BASIC METABOLIC PANEL
Anion gap: 8 (ref 5–15)
BUN: 14 mg/dL (ref 8–23)
CO2: 27 mmol/L (ref 22–32)
Calcium: 8.5 mg/dL — ABNORMAL LOW (ref 8.9–10.3)
Chloride: 106 mmol/L (ref 98–111)
Creatinine, Ser: 0.73 mg/dL (ref 0.44–1.00)
GFR calc Af Amer: >60 mL/min (ref >60)
GFR calc non Af Amer: >60 mL/min (ref >60)
GLUCOSE: 106 mg/dL — AB (ref 70–99)
Potassium: 4 mmol/L (ref 3.5–5.1)
Sodium: 141 mmol/L (ref 135–145)

## 2018-12-08 MED ORDER — LOPERAMIDE HCL 2 MG PO CAPS
2.0000 mg | ORAL_CAPSULE | Freq: Once | ORAL | Status: AC
  Administered 2018-12-08: 2 mg via ORAL
  Filled 2018-12-08: qty 1

## 2018-12-08 NOTE — Plan of Care (Signed)
Patient lying in bed this morning; husband at bedside. No complaints of pain at this time. Will continue to monitor.

## 2018-12-08 NOTE — Progress Notes (Signed)
I have reviewed and concur with the student's documentation.  

## 2018-12-08 NOTE — Progress Notes (Signed)
PROGRESS NOTE    Savannah DANTE  Hinton:[id number] DOB: [birth year] DOA: 12/05/2018 PCP: Koren Shiver, DO    Brief Narrative:   Savannah Hinton is a 84 y.o. female with medical history significant of dementia, A. fib, hypertension, hyperlipidemia presenting to the hospital for evaluation of right lower extremity redness. Son at bedside is patient's caregiver.  States patient has dementia and is oriented to self only at baseline.  States he noticed that she had an area of redness on her right lower leg about 2 weeks ago and the area has progressively been expanding in size.  Patient was previously treated with Bactrim for a week with no improvement.  She was recently started on doxycycline but again no improvement noticed.  He does not think patient has had any fevers, chills, nausea, vomiting, or diarrhea.  Does mention that she has been urinating a lot.  In addition, son is concerned that her blood pressure was low today.  Home readings with systolic in the 90s and diastolic in the 50s.  Son states patient is currently on 2 different blood pressure medications.   Assessment & Plan:   Principal Problem:   Cellulitis Active Problems:   HTN (hypertension)   Urinary frequency   Hyperkalemia   CKD (chronic kidney disease) stage 3, GFR 30-59 ml/min (HCC)  Right lower extremity cellulitis Patient presenting with progressive erythema and warmth to right lower extremity onset roughly 2 weeks ago.  Previously on Bactrim x7 days, followed by doxycycline without any improvement.  Patient is afebrile without leukocytosis with normal lactic acid of 0.9. --CT R tib/fib w/ contrast 3/12 w/ 1.3 x 3.1 x 4.1 collection likely hematoma without enhancement or gas unlikely abscess formation --Continue IV antibiotics with cefazolin  CKD stage III Stable.  Creatinine 1.0, at baseline.  Creatinine 0.73 on admission. --Renally dose all medications, avoid nephrotoxins, closely monitor renal function daily   Hypertension Blood pressure soft on arrival, improved with IV fluid bolus. --Hold home Cartia and Coreg --Starting low-dose metoprolol as below for rate control of A. fib  Paroxysmal atrial fibrillation Currently rate controlled.  Hold home Coreg and Cartia in the setting of soft blood pressure.   --Continue Eliquis for anticoagulation. --start low-dose metoprolol 12.5 mg p.o. twice daily for rate control as has small effect on BP  Dementia --Continue home Aricept, Namenda --Dementia seems to be progressing, palliative care consulted for assistance with goals of care and medical decision making.  Nutritional deficiency Patient family reports decreased appetite over the last few months, concern for progression of underlying dementia. --Nutrition consultation for evaluation and further recommendations  Debility/deconditioning --PT eval for discharge needs pending   DVT prophylaxis: Eliquis Code Status: Full code Family Communication: Husband and son at bedside Disposition Plan: Continue inpatient level of care, failed outpatient antibiotic therapy, anticipate discharge home in 1-2 days Consultants:   None  Procedures:   None  Antimicrobials:   Clindamycin 3/11-3/11  Cefazolin 3/11>>>   Subjective: Patient seen and examined at bedside, husband present.  Patient continues with fatigue/weakness.  On IV cefazolin for right lower extremity cellulitis.  Patient with underlying dementia and unable to communicate clearly. Awaiting PT evaluation for discharge needs. Patient and her family members without any other complaints at this time.  No acute concerns overnight per nursing staff.  Objective: Vitals:   12/07/18 1051 12/07/18 1456 12/07/18 2136 12/08/18 0652  BP:  (!) 143/89 (!) 153/95 (!) 143/87  Pulse:  76 83 64  Resp:  16  16 16  Temp:  98.4 F (36.9 C) 98.4 F (36.9 C) 98.6 F (37 C)  TempSrc:  Oral Oral Oral  SpO2:  97% 99% 94%  Weight: 74.7 kg        Intake/Output Summary (Last 24 hours) at 12/08/2018 1308 Last data filed at 12/08/2018 1213 Gross per 24 hour  Intake 1020 ml  Output 1860 ml  Net -840 ml   Filed Weights   12/07/18 1051  Weight: 74.7 kg    Examination:  General exam: Appears calm and comfortable  Respiratory system: Clear to auscultation. Respiratory effort normal. Cardiovascular system: S1 & S2 heard, RRR. No JVD, murmurs, rubs, gallops or clicks. No pedal edema. Gastrointestinal system: Abdomen is nondistended, soft and nontender. No organomegaly or masses felt. Normal bowel sounds heard. Central nervous system: Alert and oriented. No focal neurological deficits. Extremities: Symmetric 5 x 5 power. Skin: Right lower extremity erythema/warmth, improving, small anterior shin mobile nodule stable, left lower extremity  Psychiatry: Alert, not oriented to person place/time. Mood & affect appropriate.     Data Reviewed: I have personally reviewed following labs and imaging studies  CBC: Recent Labs  Lab 12/05/18 1956 12/07/18 0713 12/08/18 0437  WBC 8.1 8.0 7.7  NEUTROABS 5.2  --   --   HGB 13.3 11.9* 13.0  HCT 43.1 39.3 43.2  MCV 92.5 93.3 93.5  PLT 219 198 225   Basic Metabolic Panel: Recent Labs  Lab 12/05/18 1628 12/06/18 0448 12/07/18 0713 12/08/18 0437  NA 135 141 139 141  K 5.2* 4.1 4.0 4.0  CL 104 106 107 106  CO2 22 24 24 27   GLUCOSE 109* 93 103* 106*  BUN 26* 22 15 14   CREATININE 1.04* 0.86 0.77 0.73  CALCIUM 8.8* 8.7* 8.1* 8.5*   GFR: CrCl cannot be calculated (Unknown ideal weight.). Liver Function Tests: Recent Labs  Lab 12/05/18 1628  AST 25  ALT 21  ALKPHOS 93  BILITOT 0.3  PROT 7.1  ALBUMIN 3.8   No results for input(s): LIPASE, AMYLASE in the last 168 hours. No results for input(s): AMMONIA in the last 168 hours. Coagulation Profile: No results for input(s): INR, PROTIME in the last 168 hours. Cardiac Enzymes: No results for input(s): CKTOTAL, CKMB, CKMBINDEX,  TROPONINI in the last 168 hours. BNP (last 3 results) No results for input(s): PROBNP in the last 8760 hours. HbA1C: No results for input(s): HGBA1C in the last 72 hours. CBG: No results for input(s): GLUCAP in the last 168 hours. Lipid Profile: No results for input(s): CHOL, HDL, LDLCALC, TRIG, CHOLHDL, LDLDIRECT in the last 72 hours. Thyroid Function Tests: No results for input(s): TSH, T4TOTAL, FREET4, T3FREE, THYROIDAB in the last 72 hours. Anemia Panel: No results for input(s): VITAMINB12, FOLATE, FERRITIN, TIBC, IRON, RETICCTPCT in the last 72 hours. Sepsis Labs: Recent Labs  Lab 12/05/18 1628 12/05/18 1956  LATICACIDVEN 1.2 0.9    Recent Results (from the past 240 hour(s))  Blood culture (routine x 2)     Status: None (Preliminary result)   Collection Time: 12/05/18  7:56 PM  Result Value Ref Range Status   Specimen Description   Final    BLOOD LEFT ANTECUBITAL Performed at [facility], [address] [place], [place] [address]    Special Requests   Final    BOTTLES DRAWN AEROBIC AND ANAEROBIC Blood Culture adequate volume Performed at [facility], [address] [place], [place] [address]    Culture   Final  NO GROWTH 2 DAYS Performed at [facility], [address], [place], [place] [address]    Report Status PENDING  Incomplete  Blood culture (routine x 2)     Status: None (Preliminary result)   Collection Time: 12/05/18  7:56 PM  Result Value Ref Range Status   Specimen Description   Final    BLOOD LEFT HAND Performed at [facility], [address], [place], [place] [address]    Special Requests   Final    BOTTLES DRAWN AEROBIC ONLY Blood Culture adequate volume Performed at [facility], [address], [place], [place] [address]    Culture   Final    NO GROWTH 2 DAYS Performed at [facility], [address], [place], [place] [address]    Report Status  PENDING  Incomplete         Radiology Studies: Ct Tibia Fibula Right W Contrast  Result Date: 12/06/2018 CLINICAL DATA:  Right lower extremity redness and pain for approximately 2 weeks. No known injury. EXAM: CT OF THE LOWER RIGHT EXTREMITY WITH CONTRAST TECHNIQUE: Multidetector CT imaging of the lower right extremity was performed according to the standard protocol following intravenous contrast administration. COMPARISON:  None. CONTRAST:  100 mL OMNIPAQUE IOHEXOL 300 MG/ML  SOLN FINDINGS: Bones/Joint/Cartilage No acute bony or joint abnormality is identified. No bony destructive change or periosteal reaction. Osteoarthritis about the knee is noted. Chondrocalcinosis of the menisci is seen. No worrisome bone lesion. Ligaments Suboptimally assessed by CT. Muscles and Tendons Intact. No atrophy or focal lesion. No intramuscular fluid collection. No gas within muscle or tracking along fascial planes. Soft tissues There is a focal area of infiltration of subcutaneous fat along the anterior aspect of the lower leg. The lesion is centered 14 cm below the top of the fibular head and measures 1.3 cm AP x 3.1 cm transverse x 4.1 cm craniocaudal. There is no rim enhancement or gas within the collection. Density units measurements are 59. Mild infiltration of subcutaneous fat about the distal aspect of the lower leg is noted. IMPRESSION: Focal subcutaneous collection in the anterior aspect of the lower leg is likely a hematoma but could represent phlegmon. The appearance is not typical for abscess. Negative for acute bony or joint abnormality. Osteoarthritis right knee.  Chondrocalcinosis right knee also noted. Electronically Signed   By: Drusilla Kanner M.D.   On: 12/06/2018 15:52   Dg Chest Port 1 View  Result Date: 12/08/2018 CLINICAL DATA:  Shortness of breath EXAM: PORTABLE CHEST 1 VIEW COMPARISON:  December 03, 2016 FINDINGS: Stable cardiomegaly. Minimal atelectasis in the bases. No suspicious infiltrates.  No other acute abnormalities identified. IMPRESSION: Cardiomegaly.  Minimal bibasilar atelectasis. Electronically Signed   By: Gerome Sam III M.D   On: 12/08/2018 11:19        Scheduled Meds: . apixaban  5 mg Oral BID  . donepezil  10 mg Oral BID  . feeding supplement (ENSURE ENLIVE)  237 mL Oral BID BM  . loratadine  10 mg Oral Daily  . memantine  10 mg Oral BID  . metoprolol tartrate  12.5 mg Oral BID  . mirtazapine  7.5 mg Oral QHS  . timolol  1 drop Both Eyes BID   Continuous Infusions: .  ceFAZolin (ANCEF) IV 1 g (12/08/18 0628)     LOS: 2 days    Time spent: 30 minutes    Alvira Philips [place], DO [facility] Pager [phone]  If 7PM-7AM, please contact night-coverage [email or web address] Password [email or web address] 12/08/2018, 1:08 PM

## 2018-12-08 NOTE — Evaluation (Signed)
Physical Therapy Evaluation Patient Details Name: Savannah Hinton MRN: [id number] DOB: 1935/06/20 Today's Date: 12/08/2018   History of Present Illness  pt admit with increased swelling, pain and redness of RLE with cellulitis. Pt also with dementia and increaed weakness over last 2 weeks with decreased appetite.  Clinical Impression  Pt with increased weakness however tolerated session well today being the first time she has been out of bed since she arrived. To benefit from continued PT here and with HHPT to assist with transition and safety at home with husband being the main caregiver. Also recommend [facility] aide if pt able to receive assist with that as well.     Follow Up Recommendations Home health PT(recommend an aide as well to assit pt due to har don husband to assist with everything. )    Equipment Recommendations  None recommended by PT    Recommendations for Other Services       Precautions / Restrictions Precautions Precautions: None Precaution Comments: except pt used to a depends at home, so will need one with ambulating to avoid accidents  Restrictions Weight Bearing Restrictions: No      Mobility  Bed Mobility Overal bed mobility: Needs Assistance Bed Mobility: Sit to Supine;Supine to Sit     Supine to sit: Min assist     General bed mobility comments: assisted with cueing and scooting hips to edge, however she did perform quite a bit   Transfers Overall transfer level: Needs assistance Equipment used: 2 person hand held assist Transfers: Sit to/from Stand           General transfer comment: initial assit to rise with assist under her arm, however with 3 time it increasingly improved after the first time. She does rely on husband and wanted him by her side   Ambulation/Gait Ambulation/Gait assistance: Min assist;+2 safety/equipment Gait Distance (Feet): 80 Feet Assistive device: 2 person hand held assist Gait Pattern/deviations: Step-through  pattern;Narrow base of support     General Gait Details: bilater hand held assit with her husband and PT . tolerated well, may try RW to see fi she can get used to that for support. Stood up and down with pt at [facility] as NT and I cleaned and toileted pt. Pt tolerated static stadning for a while for cleaning and hand held asssit only.   Stairs            Wheelchair Mobility    Modified Rankin (Stroke Patients Only)       Balance Overall balance assessment: Needs assistance Sitting-balance support: Bilateral upper extremity supported;Feet supported Sitting balance-Leahy Scale: Good     Standing balance support: Bilateral upper extremity supported;During functional activity Standing balance-Leahy Scale: Fair                               Pertinent Vitals/Pain Pain Assessment: No/denies pain    Home Living Family/patient expects to be discharged to:: Private residence Living Arrangements: Spouse/significant other Available Help at Discharge: Family(family willing to provide ehlp if needed. Hubnand home with patient but it is a lot for him to care for all her needs. ) Type of Home: House Home Access: Stairs to enter   [employer] of Steps: 2 Home Layout: One level Home Equipment: Walker - 2 wheels;Bedside commode;Wheelchair - manual      Prior Function Level of Independence: Needs assistance         Comments: Pt's husband would cue  her with set up and supervisoion, however pt would walk to bathroom, complete her shower with set up , and and dressing. He has seen a decline in the past 2 weeks      Hand Dominance        Extremity/Trunk Assessment        Lower Extremity Assessment Lower Extremity Assessment: Generalized weakness       Communication   Communication: Expressive difficulties(due to dementia at times )  Cognition Arousal/Alertness: Awake/alert Behavior During Therapy: WFL for tasks assessed/performed Overall Cognitive  Status: History of cognitive impairments - at baseline                                 General Comments: pt states she follows commands and can have concversation at times and approriate answers in conversation at times, It varies. Today was similar just a little less interactive than her baseline according to family.       General Comments      Exercises     Assessment/Plan    PT Assessment Patient needs continued PT services  PT Problem List Decreased strength;Decreased mobility;Decreased activity tolerance       PT Treatment Interventions Gait training;Functional mobility training;Therapeutic activities;Patient/family education    PT Goals (Current goals can be found in the Care Plan section)  Acute Rehab PT Goals PT Goal Formulation: With family Time For Goal Achievement: 12/22/18 Potential to Achieve Goals: Good    Frequency Min 3X/week   Barriers to discharge        Co-evaluation               AM-PAC PT "6 Clicks" Mobility  Outcome Measure Help needed turning from your back to your side while in a flat bed without using bedrails?: A Little Help needed moving from lying on your back to sitting on the side of a flat bed without using bedrails?: A Little Help needed moving to and from a bed to a chair (including a wheelchair)?: A Little Help needed standing up from a chair using your arms (e.g., wheelchair or bedside chair)?: A Little Help needed to walk in hospital room?: A Little Help needed climbing 3-5 steps with a railing? : A Little 6 Click Score: 18    End of Session Equipment Utilized During Treatment: Gait belt Activity Tolerance: Patient tolerated treatment well Patient left: in chair;with call bell/phone within reach;with family/visitor present Nurse Communication: Mobility status PT Visit Diagnosis: Other abnormalities of gait and mobility (R26.89);Muscle weakness (generalized) (M62.81)    Time: 4920-1007 PT Time Calculation  (min) (ACUTE ONLY): 60 min   Charges:   PT Evaluation $PT Eval Moderate Complexity: 1 Mod PT Treatments $Gait Training: 8-22 mins $Therapeutic Activity: 23-37 mins        Marella Bile, PT Acute Rehabilitation Services Pager: [phone] Office: [phone] 12/08/2018   Marella Bile 12/08/2018, 5:59 PM

## 2018-12-09 DIAGNOSIS — Z515 Encounter for palliative care: Secondary | ICD-10-CM

## 2018-12-09 DIAGNOSIS — R1310 Dysphagia, unspecified: Secondary | ICD-10-CM

## 2018-12-09 DIAGNOSIS — Z7189 Other specified counseling: Secondary | ICD-10-CM

## 2018-12-09 MED ORDER — LOPERAMIDE HCL 2 MG PO CAPS
2.0000 mg | ORAL_CAPSULE | Freq: Four times a day (QID) | ORAL | Status: DC | PRN
  Administered 2018-12-09: 2 mg via ORAL
  Filled 2018-12-09: qty 1

## 2018-12-09 NOTE — Progress Notes (Signed)
PROGRESS NOTE    Savannah Hinton  ZOX:[id number] DOB: 06/24/1935 DOA: 12/05/2018 PCP: Koren Shiver, DO    Brief Narrative:  Patient with history of dementia, chronic A. fib, hypertension, hyperlipidemia who was admitted to the hospital for evaluation of right lower extremity swelling and redness.  She was treated as outpatient with 2 rounds of antibiotics.  Reportedly started with right leg bumping onto something.  Remains on antibiotics with some clinical improvement.  Family noted patient having difficulty swallowing.   Assessment & Plan:   Principal Problem:   Cellulitis Active Problems:   HTN (hypertension)   Urinary frequency   Hyperkalemia   CKD (chronic kidney disease) stage 3, GFR 30-59 ml/min (HCC)  Right lower extremity cellulitis: Presented with progressive erythema and warmth.  Failed outpatient therapy.  Clinically improving.  CT scan showed 3 x 4 cm collection.  Patient has fluctuant nontender swelling.  She is on Eliquis.  Probably hematoma.  With clinical improvement, no indication to drain at this time.  Currently on cefazolin.  If continues to improve, will treat with oral antibiotics by tomorrow.  CKD stage III: Creatinine is a stable.  Hypertension: Blood pressures are stable.  Paroxysmal A. fib: Rate controlled.  She is currently on metoprolol.  On Eliquis.  Dementia:Continue home Aricept, Namenda.  Debility/deconditioning: Continue to work with PT OT.   DVT prophylaxis: Eliquis Code Status: Full code Family Communication: Husband at the bedside Disposition Plan: Home with home health care.  Anticipate tomorrow.   Consultants:   None.  Procedures:   None.  Antimicrobials:   Ancef 1 g every 8 hours, 12/05/2018 ongoing.   Subjective: Patient seen and examined at the bedside.  Husband was at the bedside.  Patient has dementia she is poor historian.  She has no complaints. Husband stated right leg is looking better. Have episodes of  choking sensation on eating solid food.  Will place swallow therapy consult. Afebrile. Has chronic diarrhea and experiencing more frequency these days.  Objective: Vitals:   12/08/18 1514 12/08/18 2023 12/09/18 0619 12/09/18 1012  BP: 136/71 (!) 147/86 (!) 149/85 133/66  Pulse: 87 79 72 69  Resp: Temp: 97.8 F (36.6 C) (!) 97.4 F (36.3 C) 99.1 F (37.3 C)   TempSrc: Oral Oral Oral   SpO2: 97% 97% 99%   Weight:        Intake/Output Summary (Last 24 hours) at 12/09/2018 1410 Last data filed at 12/09/2018 1400 Gross per 24 hour  Intake 1269.32 ml  Output 1225 ml  Net 44.32 ml   Filed Weights   12/07/18 1051  Weight: 74.7 kg    Examination:  General exam: Appears calm and comfortable  Respiratory system: Clear to auscultation. Respiratory effort normal. Cardiovascular system: S1 & S2 heard, RRR. No JVD, murmurs, rubs, gallops or clicks. No pedal edema. Gastrointestinal system: Abdomen is nondistended, soft and nontender. No organomegaly or masses felt. Normal bowel sounds heard. Central nervous system: Alert and awake.  Not oriented.   No focal neurological deficits. Extremities: Symmetric 5 x 5 power. Skin: No rashes, lesions or ulcers Psychiatry: Judgement and insight appear normal. Mood & affect flat. Right leg with discoloration and swelling, fluctuant swelling on the front of the leg.  Nontender.  Distal neurovascular status intact.  No underlying erythema.    Data Reviewed: I have personally reviewed following labs and imaging studies  CBC: Recent Labs  Lab 12/05/18 1956 12/07/18 0713 12/08/18 0437  WBC 8.1 8.0  7.7  NEUTROABS 5.2  --   --   HGB 13.3 11.9* 13.0  HCT 43.1 39.3 43.2  MCV 92.5 93.3 93.5  PLT 219 198 225   Basic Metabolic Panel: Recent Labs  Lab 12/05/18 1628 12/06/18 0448 12/07/18 0713 12/08/18 0437  NA 135 141 139 141  K 5.2* 4.1 4.0 4.0  CL 104 106 107 106  CO2 22 24 24 27   GLUCOSE 109* 93 103* 106*  BUN 26* 22 15 14    CREATININE 1.04* 0.86 0.77 0.73  CALCIUM 8.8* 8.7* 8.1* 8.5*   GFR: CrCl cannot be calculated (Unknown ideal weight.). Liver Function Tests: Recent Labs  Lab 12/05/18 1628  AST 25  ALT 21  ALKPHOS 93  BILITOT 0.3  PROT 7.1  ALBUMIN 3.8   No results for input(s): LIPASE, AMYLASE in the last 168 hours. No results for input(s): AMMONIA in the last 168 hours. Coagulation Profile: No results for input(s): INR, PROTIME in the last 168 hours. Cardiac Enzymes: No results for input(s): CKTOTAL, CKMB, CKMBINDEX, TROPONINI in the last 168 hours. BNP (last 3 results) No results for input(s): PROBNP in the last 8760 hours. HbA1C: No results for input(s): HGBA1C in the last 72 hours. CBG: No results for input(s): GLUCAP in the last 168 hours. Lipid Profile: No results for input(s): CHOL, HDL, LDLCALC, TRIG, CHOLHDL, LDLDIRECT in the last 72 hours. Thyroid Function Tests: No results for input(s): TSH, T4TOTAL, FREET4, T3FREE, THYROIDAB in the last 72 hours. Anemia Panel: No results for input(s): VITAMINB12, FOLATE, FERRITIN, TIBC, IRON, RETICCTPCT in the last 72 hours. Sepsis Labs: Recent Labs  Lab 12/05/18 1628 12/05/18 1956  LATICACIDVEN 1.2 0.9    Recent Results (from the past 240 hour(s))  Blood culture (routine x 2)     Status: None (Preliminary result)   Collection Time: 12/05/18  7:56 PM  Result Value Ref Range Status   Specimen Description   Final    BLOOD LEFT ANTECUBITAL Performed at [facility], [address], [place], [place] [address]    Special Requests   Final    BOTTLES DRAWN AEROBIC AND ANAEROBIC Blood Culture adequate volume Performed at [facility], [address], [place], [place] [address]    Culture   Final    NO GROWTH 3 DAYS Performed at [facility], [address] [place], [place] [address]    Report Status PENDING  Incomplete  Blood culture (routine x 2)     Status: None (Preliminary result)    Collection Time: 12/05/18  7:56 PM  Result Value Ref Range Status   Specimen Description   Final    BLOOD LEFT HAND Performed at [facility], [address] [place], [place] [address]    Special Requests   Final    BOTTLES DRAWN AEROBIC ONLY Blood Culture adequate volume Performed at [facility], [address] [place], [place] [address]    Culture   Final    NO GROWTH 3 DAYS Performed at [facility], [address] [place], [place] [address]    Report Status PENDING  Incomplete         Radiology Studies: Dg Chest Port 1 View  Result Date: 12/08/2018 CLINICAL DATA:  Shortness of breath EXAM: PORTABLE CHEST 1 VIEW COMPARISON:  December 03, 2016 FINDINGS: Stable cardiomegaly. Minimal atelectasis in the bases. No suspicious infiltrates. No other acute abnormalities identified. IMPRESSION: Cardiomegaly.  Minimal bibasilar atelectasis. Electronically Signed  By: Gerome Sam III M.D   On: 12/08/2018 11:19        Scheduled Meds: . apixaban  5 mg Oral BID  . donepezil  10 mg Oral BID  . feeding supplement (ENSURE ENLIVE)  237 mL Oral BID BM  . loratadine  10 mg Oral Daily  . memantine  10 mg Oral BID  . metoprolol tartrate  12.5 mg Oral BID  . mirtazapine  7.5 mg Oral QHS  . timolol  1 drop Both Eyes BID   Continuous Infusions: .  ceFAZolin (ANCEF) IV 1 g (12/09/18 1400)     LOS: 3 days    Time spent: 25 minutes    Dorcas Carrow, MD [facility] Pager [phone]  If 7PM-7AM, please contact night-coverage [email or web address] Password [email or web address] 12/09/2018, 2:10 PM

## 2018-12-10 LAB — CULTURE, BLOOD (ROUTINE X 2)
CULTURE: NO GROWTH
Culture: NO GROWTH
Special Requests: ADEQUATE
Special Requests: ADEQUATE

## 2018-12-10 NOTE — Consult Note (Signed)
Consultation Note Date: 12/10/2018   Patient Name: Savannah Hinton  DOB: 04-Apr-1935  MRN: [id number]  Age / Sex: 84 y.o., female  PCP: Savannah Hinton Referring Physician: Dorcas CarrowGhimire, Kuber, Hinton  Reason for Consultation: Establishing goals of care  HPI/Patient Profile: 84 y.o. female  with past medical history of dementia, chronic a fib, HTN, HLP admitted on 12/05/2018 with RLE weakness and swelling.  She failed OP abx.  Noted concern for difficulty swallowing from family.  SLP eval pending.     Clinical Assessment and Goals of Care: Palliative care consult received.  Chart reviewed including personal review of pertinent labs and imaging.  Patient's son and daughter in law are present in the room.  I introduced palliative care as specialized medical care for people living with serious illness. It focuses on providing relief from the symptoms and stress of a serious illness. The goal is to improve quality of life for both the patient and the family.  Her son reports that his father has been caring for his mother at home.  They see that she does continue to progress and goal is to remain at home for as long as possible.  Discussed concern that she is now exhibiting difficulty swallowing and how this may be related to progression of dementia.  He reports understanding and recommends further conversation with his father on SLP has evaluated patient.  SUMMARY OF RECOMMENDATIONS   - Savannah Hinton continues to have decline in her nutrition, cognition, and functional status.  Began discussion of this decline in setting of her progressive dementia with concern for swallow function with her son today.  Patient's husband is her surrogate but he went home to rest this afternoon.  Son reports that his father has been caring for patient at home and goal remains for her to stay there as long as possible.  Her son and I  discussed plan for meeting with patient's husband once more input from SLP.  If she is ready for discharge prior to palliative meeting in the hospital, she would benefit from outpatient palliative care.  She is Savannah Hinton patient and would qualify for outpatient palliative through [facility] program at [facility] if family is agreeable.    Code Status/Advance Care Planning:  Full code- Not discussed this evening  Palliative Prophylaxis:   Aspiration, Delirium Protocol and Frequent Pain Assessment  Additional Recommendations (Limitations, Scope, Preferences):  Full Scope Treatment  Psycho-social/Spiritual:   Desire for further Chaplaincy support:no  Additional Recommendations: Caregiving  Support/Resources  Prognosis:   Unable to determine  Discharge Planning: Home with Home Health      Primary Diagnoses: Present on Admission: . Cellulitis . HTN (hypertension)   I have reviewed the medical record, interviewed the patient and family, and examined the patient. The following aspects are pertinent.  Past Medical History:  Diagnosis Date  . Allergy   . Atrial fibrillation (HCC)   . Dementia (HCC)   . Glaucoma   . Hyperlipidemia   . Hypertension   .  Memory loss   . Vertigo    Social History   Socioeconomic History  . Marital status: Married    Spouse name: Savannah Hinton  . Number of children: 1  . Years of education: Not on file  . Highest education level: Not on file  Occupational History    Comment: retired  Engineer, production  . Financial resource strain: Not on file  . Food insecurity:    Worry: Not on file    Inability: Not on file  . Transportation needs:    Medical: Not on file    Non-medical: Not on file  Tobacco Use  . Smoking status: Never Smoker  . Smokeless tobacco: Never Used  Substance and Sexual Activity  . Alcohol use: No    Alcohol/week: 0.0 standard drinks  . Drug use: No  . Sexual activity: Not on file  Lifestyle  . Physical  activity:    Days per week: Not on file    Minutes per session: Not on file  . Stress: Not on file  Relationships  . Social connections:    Talks on phone: Not on file    Gets together: Not on file    Attends religious service: Not on file    Active member of club or organization: Not on file    Attends meetings of clubs or organizations: Not on file    Relationship status: Not on file  Other Topics Concern  . Not on file  Social History Narrative   Patient lives at home with her husband Savannah Hinton ) and her son was with her also Savannah Hua.   Retired.   Caffeine- half cup of coffee.   Right handed.      Family History  Problem Relation Age of Onset  . Stroke Mother   . Stroke Father   . Cancer Sister        breast  . Heart attack Paternal Uncle    Scheduled Meds: . apixaban  5 mg Oral BID  . donepezil  10 mg Oral BID  . feeding supplement (ENSURE ENLIVE)  237 mL Oral BID BM  . loratadine  10 mg Oral Daily  . memantine  10 mg Oral BID  . metoprolol tartrate  12.5 mg Oral BID  . mirtazapine  7.5 mg Oral QHS  . timolol  1 drop Both Eyes BID   Continuous Infusions: .  ceFAZolin (ANCEF) IV 1 g (12/10/18 0503)   PRN Meds:.acetaminophen **OR** acetaminophen, loperamide, Melatonin, sodium chloride Medications Prior to Admission:  Prior to Admission medications   Medication Sig Start Date End Date Taking? Authorizing Provider  CARTIA XT 120 MG 24 hr capsule Take 120 mg by mouth daily.  11/28/17  Yes Provider, Historical, Hinton  carvedilol (COREG) 3.125 MG tablet Take 3.125 mg by mouth 2 (two) times daily with a meal.  11/28/17  Yes Provider, Historical, Hinton  cetirizine (ZYRTEC) 10 MG tablet Take 10 mg by mouth daily.   Yes Provider, Historical, Hinton  Cyanocobalamin (VITAMIN B 12 PO) Take 1 tablet by mouth daily.    Yes Provider, Historical, Hinton  donepezil (ARICEPT) 10 MG tablet Take 1 tablet (10 mg total) by mouth 2 (two) times daily. 06/05/18  Yes Nilda Riggs, NP  doxycycline  (VIBRAMYCIN) 100 MG capsule Take 100 mg by mouth 2 (two) times daily.   Yes Provider, Historical, Hinton  ELIQUIS 5 MG TABS tablet TAKE 1 TABLET(5 MG) BY MOUTH TWICE DAILY Patient taking differently: Take 5 mg by mouth 2 (two)  times daily.  03/27/17  Yes Jake Bathe, Hinton  fexofenadine (ALLEGRA) 180 MG tablet Take 180 mg by mouth daily.   Yes Provider, Historical, Hinton  fluconazole (DIFLUCAN) 150 MG tablet Take 150 mg by mouth every other day.   Yes Provider, Historical, Hinton  glycopyrrolate (ROBINUL) 1 MG tablet Take 0.5-1 mg by mouth 3 (three) times daily.   Yes Provider, Historical, Hinton  loperamide (IMODIUM) 2 MG capsule Take by mouth as needed for diarrhea or loose stools.   Yes Provider, Historical, Hinton  memantine (NAMENDA) 10 MG tablet Take 1 tablet (10 mg total) by mouth 2 (two) times daily. 06/05/18  Yes Nilda Riggs, NP  sodium chloride (OCEAN) 0.65 % SOLN nasal spray Place 1 spray into both nostrils as needed for congestion.   Yes Provider, Historical, Hinton  timolol (TIMOPTIC) 0.5 % ophthalmic solution Place 1 drop into both eyes 2 (two) times daily.  06/02/11  Yes Provider, Historical, Hinton  sulfamethoxazole-trimethoprim (BACTRIM DS,SEPTRA DS) 800-160 MG tablet Take 1 tablet by mouth 2 (two) times daily.    Provider, Historical, Hinton   Allergies  Allergen Reactions  . Codeine     hallucinations  . Penicillins Other (See Comments)    Yeast infection Did it involve swelling of the face/tongue/throat, SOB, or low BP? N Did it involve sudden or severe rash/hives, skin peeling, or any reaction on the inside of your mouth or nose? N Did you need to seek medical attention at a hospital or doctor's office? N When did it last happen?more than 10 years If all above answers are "NO", may proceed with cephalosporin use.     Review of Systems Denies complaints: confused  Physical Exam  General: Alert, awake, in no acute distress.  HEENT: No bruits, no goiter, no JVD Heart: Regular rate and  rhythm. No murmur appreciated. Abdomen: Soft, nontender, nondistended, positive bowel sounds.  Ext: No significant edema Skin: Warm and dry.  RLE swelling and discoloration Neuro: Grossly intact, nonfocal.  Vital Signs: BP (!) 149/71 (BP Location: Right Arm)   Pulse 94   Temp 98.4 F (36.9 C) (Oral)   Resp 18   Wt 74.7 kg   SpO2 98%   BMI 28.27 kg/m  Pain Scale: 0-10   Pain Score: 0-No pain   SpO2: SpO2: 98 % O2 Device:SpO2: 98 % O2 Flow Rate: .   IO: Intake/output summary:   Intake/Output Summary (Last 24 hours) at 12/10/2018 9476 Last data filed at 12/10/2018 0600 Gross per 24 hour  Intake 1319.32 ml  Output 1400 ml  Net -80.68 ml    LBM: Last BM Date: 12/08/18 Baseline Weight: Weight: 74.7 kg Most recent weight: Weight: 74.7 kg     Palliative Assessment/Data:   Flowsheet Rows     Most Recent Value  Intake Tab  Referral Department  Hospitalist  Unit at Time of Referral  Med/Surg Unit  Palliative Care Primary Diagnosis  Neurology  Date Notified  12/08/18  Palliative Care Type  New Palliative care  Reason for referral  Clarify Goals of Care  Date of Admission  12/06/18  Date first seen by Palliative Care  12/09/18  # of days Palliative referral response time  1 Day(s)  # of days IP prior to Palliative referral  2  Clinical Assessment  Palliative Performance Scale Score  50%  Pain Max last 24 hours  Not able to report  Pain Min Last 24 hours  Not able to report  Dyspnea Max Last 24 Hours  Not able to report  Dyspnea Min Last 24 hours  Not able to report  Psychosocial & Spiritual Assessment  Palliative Care Outcomes  Patient/Family meeting held?  Yes  Who was at the meeting?  Son      Time In: 53 Time Out: 1835 Time Total: 55 Greater than 50%  of this time was spent counseling and coordinating care related to the above assessment and plan.  Signed by: Romie Minus, Hinton   Please contact Palliative Medicine Team phone at [phone] for questions and  concerns.  For individual provider: See Loretha Stapler

## 2018-12-10 NOTE — Evaluation (Signed)
Clinical/Bedside Swallow Evaluation Patient Details  Name: Savannah Hinton MRN: [id number] Date of Birth: September 13, 1935  Today's Date: 12/10/2018 Time: SLP Start Time (ACUTE ONLY): 0949 SLP Stop Time (ACUTE ONLY): 1040 SLP Time Calculation (min) (ACUTE ONLY): 51 min  Past Medical History:  Past Medical History:  Diagnosis Date  . Allergy   . Atrial fibrillation (HCC)   . Dementia (HCC)   . Glaucoma   . Hyperlipidemia   . Hypertension   . Memory loss   . Vertigo    Past Surgical History:  Past Surgical History:  Procedure Laterality Date  . APPENDECTOMY  1974  . CHOLECYSTECTOMY  2012  . RETINAL DETACHMENT SURGERY     HPI:      Assessment / Plan / Recommendation Clinical Impression  Pt presents with cognitive based dysphagia due to her dementia suspect with exacerbation due to possible sensitive dentition.  Spouse reports she used Sensodyne years ago - and pt verbalize concern for teeth with mastication.    Advised spouse use Sensodyne again *mixed with a little flavored toothpaste* to encourage her to brush her teeth as he states pt will not brush her teeth.  Reviewed importance of oral care for pulmonary hygiene.    Per spouse, pt with 60 pound weight loss over 2 years *however 20 pounds was excess fluid removed* thus is closer to 40 pound weight loss.  She has only been consuming liquids per spouse.  No indication of aspiration with po pt would accept (Sprite, pudding, pills with pudding, single bite of cracker).   Rotary mastication pattern noted without residuals.  She does present with hypoglossal nerve *tongue deviates right on protrusion* and vagal/palatoglossal nerve impairment *soft palate deviates left*.  Oral deficits with transiting pills noted - RN able to give *took time* with Ensure.   Spouse reports he gives pt's pills dry and follows with liquids- advised to continue to try with pudding.    Educated spouse to gustatory changes with dementia indicating possible  benefit of sweetener added to foods.  Also reviewed possible benefit of protein shakes, etc if pt will not consume solids.  Using teach back and written information, pt and spouse educated thoroughly.  No SLP follow up at this time.  Thanks for this consult.   SLP Visit Diagnosis: Dysphagia, oral phase (R13.11)    Aspiration Risk  Mild aspiration risk    Diet Recommendation Regular;Thin liquid(self feeding encouraged, finger foods)   Liquid Administration via: Cup;Straw Medication Administration: Whole meds with liquid(as tolerated, ? with Ensure) Supervision: Staff to assist with self feeding;Comment(spouse helps with feeding) Compensations: Slow rate;Small sips/bites(start intake with liquids) Postural Changes: Seated upright at 90 degrees;Remain upright for at least 30 minutes after po intake    Other  Recommendations Oral Care Recommendations: Oral care BID   Buy SENSODYNE!  Pt has h/o dentition sensitivity years ago per spouse.    Follow up Recommendations None      Frequency and Duration     n/a       Prognosis   n/a     Swallow Study   General      Oral/Motor/Sensory Function Overall Oral Motor/Sensory Function: Mild impairment Facial ROM: Within Functional Limits Facial Symmetry: Within Functional Limits Facial Strength: Within Functional Limits Facial Sensation: Other (Comment) Lingual ROM: Reduced right(slight deviation to the right upon protrusion) Lingual Symmetry: Within Functional Limits Lingual Strength: Within Functional Limits Lingual Sensation: Other (Comment) Velum: Other (comment)(slight deviation to left upon phonation)   Ice Chips Ice  chips: Not tested   Thin Liquid Thin Liquid: Within functional limits Presentation: Straw    Nectar Thick Nectar Thick Liquid: Within functional limits Presentation: Straw Other Comments: delayed swallow   Honey Thick Honey Thick Liquid: Not tested   Puree Puree: Impaired Presentation: Spoon;Self Fed Oral Phase  Functional Implications: Prolonged oral transit Pharyngeal Phase Impairments: Suspected delayed Swallow Other Comments: suspect delay oral transit and pharyngeal trigger   Solid     Solid: Impaired Oral Phase Functional Implications: Prolonged oral transit Other Comments: slow but effective mastication, no oral residuals present      Chales Abrahams 12/10/2018,11:08 AM   Donavan Burnet, MS [facility] SLP Acute Rehab Services Pager [phone] Office [phone]

## 2018-12-10 NOTE — Discharge Summary (Signed)
Physician Discharge Summary  KOLBI TOFTE ZOX:[id number] DOB: 09-19-1935 DOA: 12/05/2018  PCP: Koren Shiver, DO  Admit date: 12/05/2018 Discharge date: 12/10/2018  Admitted From: Home. Disposition: Home with home health care.  Recommendations for Outpatient Follow-up:  1. Follow up with PCP in 1-2 weeks   Home Health: Yes Equipment/Devices: None  Discharge Condition: Stable CODE STATUS: Full code Diet recommendation: Regular diet  Brief/Interim Summary: Patient with history of dementia, chronic A. fib, hypertension, hyperlipidemia who was admitted to the hospital for evaluation of right lower extremity swelling and redness.  She was treated as outpatient with 2 rounds of antibiotics.  Reportedly started with right leg bumping onto something.    Discharge Diagnoses:  Principal Problem:   Cellulitis Active Problems:   HTN (hypertension)   Urinary frequency   Hyperkalemia   CKD (chronic kidney disease) stage 3, GFR 30-59 ml/min (HCC)  Right lower extremity cellulitis: Presented with progressive erythema and warmth.  Failed outpatient therapy.  Clinically improving.  CT scan showed 3 x 4 cm collection.  Patient has fluctuant nontender swelling.  She is on Eliquis.  Probably hematoma.  With clinical improvement, no indication to drain at this time.   Her surrounding inflammation the erythema has mostly improved. She was taking doxycycline before coming to the hospital, she will finish remaining 6 days of oral doxycycline therapy.  CKD stage III: Creatinine is 9  stable.  Hypertension: Blood pressures are stable.  She can resume Coreg and diltiazem.  Paroxysmal A. fib: Rate controlled.  She is currently on Coreg and diltiazem. On Eliquis.  Dementia: Continue home Aricept, Namenda. Patient was also seen by speech therapist because of complain about pocketing food and not eating solid food. Bedside swallow evaluation was normal.  Probably due to underlying  dementia. Recommended all aspiration precautions, dysphagia treatment.    Discharge Instructions  Discharge Instructions    Call MD for:  redness, tenderness, or signs of infection (pain, swelling, redness, odor or green/yellow discharge around incision site)   Complete by:  As directed    Call MD for:  severe uncontrolled pain   Complete by:  As directed    Diet general   Complete by:  As directed    Discharge instructions   Complete by:  As directed    Elevate legs on pillows. Follow speech therapy recommendation for food intake.   Increase activity slowly   Complete by:  As directed    No dressing needed   Complete by:  As directed    If wound opens up, keep dry dressing, can use moisturizer.     Allergies as of 12/10/2018      Reactions   Codeine    hallucinations   Penicillins Other (See Comments)   Yeast infection Did it involve swelling of the face/tongue/throat, SOB, or low BP? N Did it involve sudden or severe rash/hives, skin peeling, or any reaction on the inside of your mouth or nose? N Did you need to seek medical attention at a hospital or doctor's office? N When did it last happen?more than 10 years If all above answers are "NO", may proceed with cephalosporin use.      Medication List    STOP taking these medications   sulfamethoxazole-trimethoprim 800-160 MG tablet Commonly known as:  BACTRIM DS,SEPTRA DS     TAKE these medications   Cartia XT 120 MG 24 hr capsule Generic drug:  diltiazem Take 120 mg by mouth daily.   carvedilol 3.125 MG tablet  Commonly known as:  COREG Take 3.125 mg by mouth 2 (two) times daily with a meal.   cetirizine 10 MG tablet Commonly known as:  ZYRTEC Take 10 mg by mouth daily.   donepezil 10 MG tablet Commonly known as:  ARICEPT Take 1 tablet (10 mg total) by mouth 2 (two) times daily.   doxycycline 100 MG capsule Commonly known as:  VIBRAMYCIN Take 100 mg by mouth 2 (two) times daily.   Eliquis 5 MG  Tabs tablet Generic drug:  apixaban TAKE 1 TABLET(5 MG) BY MOUTH TWICE DAILY What changed:  See the new instructions.   fexofenadine 180 MG tablet Commonly known as:  ALLEGRA Take 180 mg by mouth daily.   fluconazole 150 MG tablet Commonly known as:  DIFLUCAN Take 150 mg by mouth every other day.   glycopyrrolate 1 MG tablet Commonly known as:  ROBINUL Take 0.5-1 mg by mouth 3 (three) times daily.   loperamide 2 MG capsule Commonly known as:  IMODIUM Take by mouth as needed for diarrhea or loose stools.   memantine 10 MG tablet Commonly known as:  Namenda Take 1 tablet (10 mg total) by mouth 2 (two) times daily.   sodium chloride 0.65 % Soln nasal spray Commonly known as:  OCEAN Place 1 spray into both nostrils as needed for congestion.   timolol 0.5 % ophthalmic solution Commonly known as:  TIMOPTIC Place 1 drop into both eyes 2 (two) times daily.   VITAMIN B 12 PO Take 1 tablet by mouth daily.      Follow-up Information    Koren Shiver, DO Follow up in 2 week(s).   Specialty:  Family Medicine Contact information: [address] [place] [address] [phone]          Allergies  Allergen Reactions  . Codeine     hallucinations  . Penicillins Other (See Comments)    Yeast infection Did it involve swelling of the face/tongue/throat, SOB, or low BP? N Did it involve sudden or severe rash/hives, skin peeling, or any reaction on the inside of your mouth or nose? N Did you need to seek medical attention at a hospital or doctor's office? N When did it last happen?more than 10 years If all above answers are "NO", may proceed with cephalosporin use.      Consultations:  Speech therapy.   Procedures/Studies: Ct Tibia Fibula Right W Contrast  Result Date: 12/06/2018 CLINICAL DATA:  Right lower extremity redness and pain for approximately 2 weeks. No known injury. EXAM: CT OF THE LOWER RIGHT EXTREMITY WITH CONTRAST TECHNIQUE:  Multidetector CT imaging of the lower right extremity was performed according to the standard protocol following intravenous contrast administration. COMPARISON:  None. CONTRAST:  100 mL OMNIPAQUE IOHEXOL 300 MG/ML  SOLN FINDINGS: Bones/Joint/Cartilage No acute bony or joint abnormality is identified. No bony destructive change or periosteal reaction. Osteoarthritis about the knee is noted. Chondrocalcinosis of the menisci is seen. No worrisome bone lesion. Ligaments Suboptimally assessed by CT. Muscles and Tendons Intact. No atrophy or focal lesion. No intramuscular fluid collection. No gas within muscle or tracking along fascial planes. Soft tissues There is a focal area of infiltration of subcutaneous fat along the anterior aspect of the lower leg. The lesion is centered 14 cm below the top of the fibular head and measures 1.3 cm AP x 3.1 cm transverse x 4.1 cm craniocaudal. There is no rim enhancement or gas within the collection. Density units measurements are 59. Mild  infiltration of subcutaneous fat about the distal aspect of the lower leg is noted. IMPRESSION: Focal subcutaneous collection in the anterior aspect of the lower leg is likely a hematoma but could represent phlegmon. The appearance is not typical for abscess. Negative for acute bony or joint abnormality. Osteoarthritis right knee.  Chondrocalcinosis right knee also noted. Electronically Signed   By: Drusilla Kanner M.D.   On: 12/06/2018 15:52   Dg Chest Port 1 View  Result Date: 12/08/2018 CLINICAL DATA:  Shortness of breath EXAM: PORTABLE CHEST 1 VIEW COMPARISON:  December 03, 2016 FINDINGS: Stable cardiomegaly. Minimal atelectasis in the bases. No suspicious infiltrates. No other acute abnormalities identified. IMPRESSION: Cardiomegaly.  Minimal bibasilar atelectasis. Electronically Signed   By: Gerome Sam III M.D   On: 12/08/2018 11:19       Subjective: Patient seen and examined on the day of discharge.  She has dementia and poor  historian, however she was excited to go home.  Husband at the bedside.  No overnight events.   Discharge Exam: Vitals:   12/09/18 2108 12/10/18 0458  BP: 138/88 (!) 149/71  Pulse: 82 94  Resp: 18 18  Temp: 97.8 F (36.6 C) 98.4 F (36.9 C)  SpO2: 99% 98%   Vitals:   12/09/18 1012 12/09/18 1415 12/09/18 2108 12/10/18 0458  BP: 133/66 (!) 127/55 138/88 (!) 149/71  Pulse: 69 61 82 94  Resp:  18 18 18   Temp:  98.3 F (36.8 C) 97.8 F (36.6 C) 98.4 F (36.9 C)  TempSrc:  Oral Oral Oral  SpO2:  100% 99% 98%  Weight:        General: Pt is alert, awake, not in acute distress Cardiovascular: RRR, S1/S2 +, no rubs, no gallops Respiratory: CTA bilaterally, no wheezing, no rhonchi Abdominal: Soft, NT, ND, bowel sounds + Extremities: no edema, no cyanosis Both legs with pigmentation. Right leg has a 2 x 2 centimeter fluctuant swelling, nontender on the anterior aspect. No underlying induration, distal neurovascular status intact.    The results of significant diagnostics from this hospitalization (including imaging, microbiology, ancillary and laboratory) are listed below for reference.     Microbiology: Recent Results (from the past 240 hour(s))  Blood culture (routine x 2)     Status: None   Collection Time: 12/05/18  7:56 PM  Result Value Ref Range Status   Specimen Description   Final    BLOOD LEFT ANTECUBITAL Performed at [facility], [address] [place], [place] [address]    Special Requests   Final    BOTTLES DRAWN AEROBIC AND ANAEROBIC Blood Culture adequate volume Performed at [facility], [address] [place], [place] [address]    Culture   Final    NO GROWTH 5 DAYS Performed at [facility], [address], [place], [place] [address]    Report Status 12/10/2018 FINAL  Final  Blood culture (routine x 2)     Status: None   Collection Time: 12/05/18  7:56 PM  Result Value Ref Range Status   Specimen  Description   Final    BLOOD LEFT HAND Performed at [facility], [address], [place], [place] [address]    Special Requests   Final    BOTTLES DRAWN AEROBIC ONLY Blood Culture adequate volume Performed at [facility], [address] [place], [place] [address]    Culture   Final    NO GROWTH 5 DAYS Performed at  [facility], [address] [place], [place] [address]    Report Status 12/10/2018 FINAL  Final     Labs: BNP (last 3 results) No results for input(s): BNP in the last 8760 hours. Basic Metabolic Panel: Recent Labs  Lab 12/05/18 1628 12/06/18 0448 12/07/18 0713 12/08/18 0437  NA 135 141 139 141  K 5.2* 4.1 4.0 4.0  CL 104 106 107 106  CO2 GLUCOSE 109* 93 103* 106*  BUN 26* CREATININE 1.04* 0.86 0.77 0.73  CALCIUM 8.8* 8.7* 8.1* 8.5*   Liver Function Tests: Recent Labs  Lab 12/05/18 1628  AST 25  ALT 21  ALKPHOS 93  BILITOT 0.3  PROT 7.1  ALBUMIN 3.8   No results for input(s): LIPASE, AMYLASE in the last 168 hours. No results for input(s): AMMONIA in the last 168 hours. CBC: Recent Labs  Lab 12/05/18 1956 12/07/18 0713 12/08/18 0437  WBC 8.1 8.0 7.7  NEUTROABS 5.2  --   --   HGB 13.3 11.9* 13.0  HCT 43.1 39.3 43.2  MCV 92.5 93.3 93.5  PLT 219 198 225   Cardiac Enzymes: No results for input(s): CKTOTAL, CKMB, CKMBINDEX, TROPONINI in the last 168 hours. BNP: Invalid input(s): POCBNP CBG: No results for input(s): GLUCAP in the last 168 hours. D-Dimer No results for input(s): DDIMER in the last 72 hours. Hgb A1c No results for input(s): HGBA1C in the last 72 hours. Lipid Profile No results for input(s): CHOL, HDL, LDLCALC, TRIG, CHOLHDL, LDLDIRECT in the last 72 hours. Thyroid function studies No results for input(s): TSH, T4TOTAL, T3FREE, THYROIDAB in the last 72 hours.  Invalid input(s): FREET3 Anemia work up No results for input(s): VITAMINB12, FOLATE, FERRITIN, TIBC, IRON,  RETICCTPCT in the last 72 hours. Urinalysis    Component Value Date/Time   COLORURINE COLORLESS (A) 12/05/2018 1956   APPEARANCEUR CLEAR 12/05/2018 1956   LABSPEC 1.003 (L) 12/05/2018 1956   PHURINE 7.0 12/05/2018 1956   GLUCOSEU NEGATIVE 12/05/2018 1956   HGBUR NEGATIVE 12/05/2018 1956   BILIRUBINUR NEGATIVE 12/05/2018 1956   KETONESUR NEGATIVE 12/05/2018 1956   PROTEINUR NEGATIVE 12/05/2018 1956   NITRITE NEGATIVE 12/05/2018 1956   LEUKOCYTESUR NEGATIVE 12/05/2018 1956   Sepsis Labs Invalid input(s): PROCALCITONIN,  WBC,  LACTICIDVEN Microbiology Recent Results (from the past 240 hour(s))  Blood culture (routine x 2)     Status: None   Collection Time: 12/05/18  7:56 PM  Result Value Ref Range Status   Specimen Description   Final    BLOOD LEFT ANTECUBITAL Performed at [facility], [address] [place], [place] [address]    Special Requests   Final    BOTTLES DRAWN AEROBIC AND ANAEROBIC Blood Culture adequate volume Performed at [facility], [address] [place], [place] [address]    Culture   Final    NO GROWTH 5 DAYS Performed at [facility], [address] [place], [place] [address]    Report Status 12/10/2018 FINAL  Final  Blood culture (routine x 2)     Status: None   Collection Time: 12/05/18  7:56 PM  Result Value Ref Range Status   Specimen Description   Final    BLOOD LEFT HAND Performed at [facility], [address] [place], [place] [address]    Special Requests   Final    BOTTLES DRAWN AEROBIC ONLY Blood Culture adequate volume Performed at [facility], [address], [place], [place] [address]    Culture   Final    NO GROWTH 5 DAYS Performed at [facility], [address] [place], [place] [address]    Report Status 12/10/2018 FINAL  Final     Time coordinating discharge: 31 minutes  SIGNED:   Dorcas Carrow, MD  [facility] 12/10/2018, 11:09  AM Pager [phone]  If 7PM-7AM, please contact night-coverage [email or web address] Password [email or web address]

## 2018-12-14 DIAGNOSIS — R300 Dysuria: Secondary | ICD-10-CM | POA: Diagnosis not present

## 2019-01-01 DIAGNOSIS — R300 Dysuria: Secondary | ICD-10-CM | POA: Diagnosis not present

## 2019-01-21 ENCOUNTER — Other Ambulatory Visit: Payer: Self-pay

## 2019-01-21 ENCOUNTER — Ambulatory Visit (INDEPENDENT_AMBULATORY_CARE_PROVIDER_SITE_OTHER): Payer: Medicare Other | Admitting: Neurology

## 2019-01-21 DIAGNOSIS — G309 Alzheimer's disease, unspecified: Secondary | ICD-10-CM

## 2019-01-21 DIAGNOSIS — F0280 Dementia in other diseases classified elsewhere without behavioral disturbance: Secondary | ICD-10-CM

## 2019-01-21 MED ORDER — MEMANTINE HCL 10 MG PO TABS: 10.0000 mg | ORAL_TABLET | Freq: Two times a day (BID) | ORAL | 4 refills | Status: AC

## 2019-01-21 MED ORDER — DONEPEZIL HCL 10 MG PO TABS: 10.0000 mg | ORAL_TABLET | Freq: Two times a day (BID) | ORAL | 4 refills | Status: AC

## 2019-01-21 NOTE — Progress Notes (Signed)
[facility]  PATIENT: Savannah Hinton DOB: July 21, 1935   REASON FOR VISIT: Follow-up for Alzheimer's disease HISTORY FROM: Husband    HISTORY OF PRESENT ILLNESS: Savannah RalphsVivian is a 84 years old right-handed Caucasian female, referred by her primary care physician Dr. Marinda Elkobert Fried, for evaluation of memory loss. 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  Since 2013, she was noticed to have mild word finding difficulties, memory trouble, difficulty working with members, difficulty to balance her checkbook, she denied visual loss, she has good appetite, sleeping well, she has occasionally diarrhea with greasy food.   Both of her parents died of stroke at age 6770s, her older sister died of Alzheimer's at age 82   MRI of the brain in 2014, demonstrated moderate atrophy, mainly involving bilateral frontal, parietal, temporal region, mild small vessel disease, laboratory showed low normal B12, 220, rest of the laboratory was normal including RPR, TSH, ANA, CMP with exception of mild elevated glucose 130, She is taking B12 supplement by mouth. She does not exercise. Appetite is reportedly good she is sleeping well. She denies side effects to Aricept .   She continues to drive short distances with out getting lost. No longer cooks. Independent in dressing bathing and eating. She returns for reevaluation  UPDATE Sep 5th 2017: She is with her husband, she went to ED on July 17th 2017, she had sudden onset vertigo, lasted for few minutes.  She has been sleeping on recliner, scared to have recurrent vertigo with sudden positional change such as lying down,  I personally reviewed MRI July 17th 2017, there was no acute abnormality, mild cerebral atrophy and small vessel disease,  Laboratory evaluations, normal INR CMP, with  exception of mild elevated glucose 109, normal CBC  She has slow worsening memory loss, no longer cooking, able to do simple house chore, able to maintain is no hygiene, dressing independently,  Update November 29 2016: She is here with her husband, overall doing very well, taking Aricept 23 mg daily, Namenda 10 mg twice a day, slight worsening of her memory loss, Mini-Mental Status Examination 16/30, animal naming 4.    Virtual Visit via phone  I connected with Savannah GumVivian M Hinton on 01/21/19 at  by phone and verified that I am speaking with the correct person using two identifiers.   I discussed the limitations, risks, security and privacy concerns of performing an evaluation and management service by phone and the availability of in person appointments. I also discussed with the patient that there may be a patient responsible charge related to this service. The patient expressed understanding and agreed to proceed.   History of Present Illness: She is with her husband during today's phone interview, she has poor appetite, excessive drooling, sleepiness, sleep all the time, no longer active, she was admitted to the hospital December 10, 2018 for right lower extremity swelling redness, cellulitis, much improved with antibiotic treatment,  Observations/Objective: I have reviewed problem lists, medications, allergies.  She is not oriented to year, her age,  Blood pressure pressure reported by her husband is 143/87, heart rate of 76, glucose of 131  Assessment and Plan: Dementia  Progressively worsening  Namenda 10mg  bid, Aricept 10mg  bid    Follow Up Instructions:   In 6 months   I discussed the assessment and treatment plan with the patient. The  patient was provided an opportunity to ask questions and all were answered. The patient agreed with the plan and demonstrated an understanding of the instructions.   The patient was advised to call back or seek an in-person evaluation if the  symptoms worsen or if the condition fails to improve as anticipated.  I provided 25 minutes of non-face-to-face time during this encounter.   Levert Feinstein, MD

## 2019-01-25 ENCOUNTER — Encounter: Payer: Self-pay | Admitting: Neurology

## 2019-02-20 DIAGNOSIS — R32 Unspecified urinary incontinence: Secondary | ICD-10-CM | POA: Diagnosis not present

## 2019-02-20 DIAGNOSIS — F0390 Unspecified dementia without behavioral disturbance: Secondary | ICD-10-CM | POA: Diagnosis not present

## 2019-02-20 DIAGNOSIS — I4811 Longstanding persistent atrial fibrillation: Secondary | ICD-10-CM | POA: Diagnosis not present

## 2019-02-20 DIAGNOSIS — Z7901 Long term (current) use of anticoagulants: Secondary | ICD-10-CM | POA: Diagnosis not present

## 2019-04-30 DIAGNOSIS — R350 Frequency of micturition: Secondary | ICD-10-CM | POA: Diagnosis not present

## 2019-05-02 DIAGNOSIS — F0390 Unspecified dementia without behavioral disturbance: Secondary | ICD-10-CM | POA: Diagnosis not present

## 2019-05-02 DIAGNOSIS — R627 Adult failure to thrive: Secondary | ICD-10-CM | POA: Diagnosis not present

## 2019-05-02 DIAGNOSIS — I509 Heart failure, unspecified: Secondary | ICD-10-CM | POA: Diagnosis not present

## 2019-05-02 DIAGNOSIS — I482 Chronic atrial fibrillation, unspecified: Secondary | ICD-10-CM | POA: Diagnosis not present

## 2019-07-17 ENCOUNTER — Telehealth: Payer: Self-pay | Admitting: Neurology

## 2019-07-17 NOTE — Telephone Encounter (Signed)
Pt husband called to inform that pt passed on 07/26/2019

## 2019-07-28 DEATH — deceased

## 2020-01-20 IMAGING — CT CT OF THE RIGHT TIBIA AND FIBULA WITH CONTRAST
3 series · 10 of 33 positions shown, 12 images · IV contrast (agent unspecified)
Comparison: None.

CONTRAST:  100 mL OMNIPAQUE IOHEXOL 300 MG/ML  SOLN

CLINICAL DATA: Right lower extremity redness and pain for
approximately 2 weeks. No known injury.

EXAM:
CT OF THE LOWER RIGHT EXTREMITY WITH CONTRAST
TECHNIQUE: Multidetector CT imaging of the lower right extremity was performed
according to the standard protocol following intravenous contrast
administration.

[Series 8: axial st · axial · 0.33mm/px · z∈[-1476,-1273]mm · 2 of 293 slices shown, 3 images]
[im 90/293  soft-tissue]
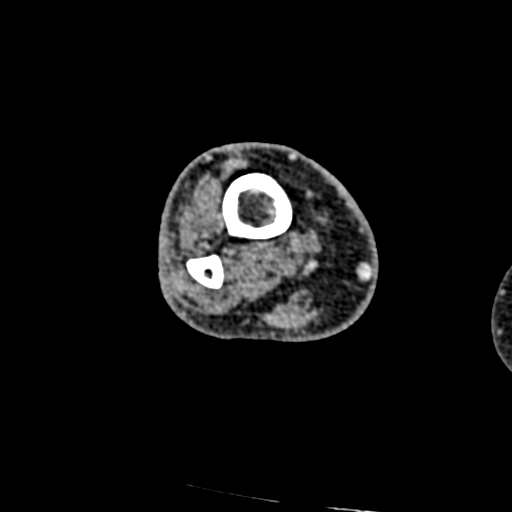
[im 90/293  bone]
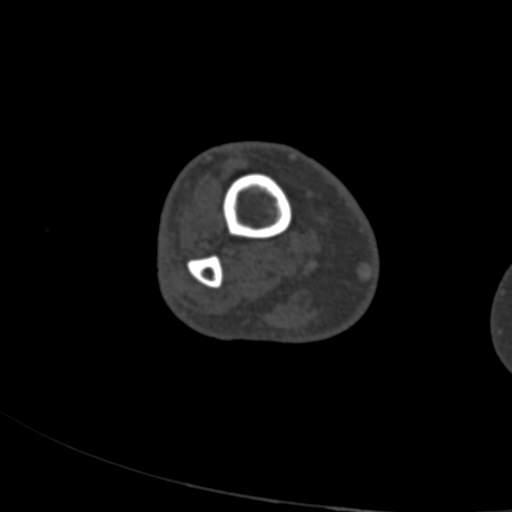
[im 225/293  bone]
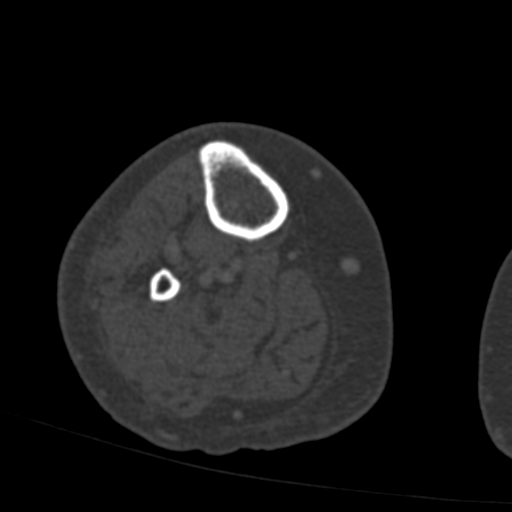

[Series 9: coronal st · coronal · 0.52mm/px · 3 of 112 slices shown]
[im 23/112  bone]
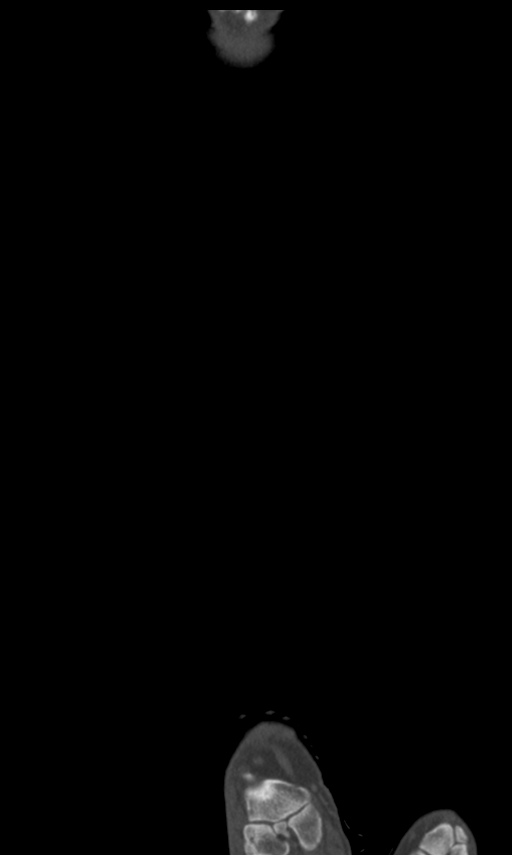
[im 45/112  bone]
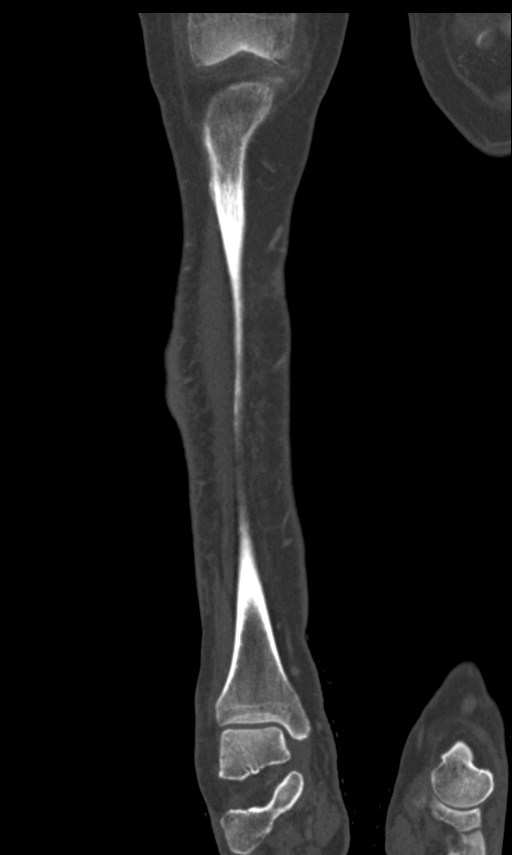
[im 67/112  bone]
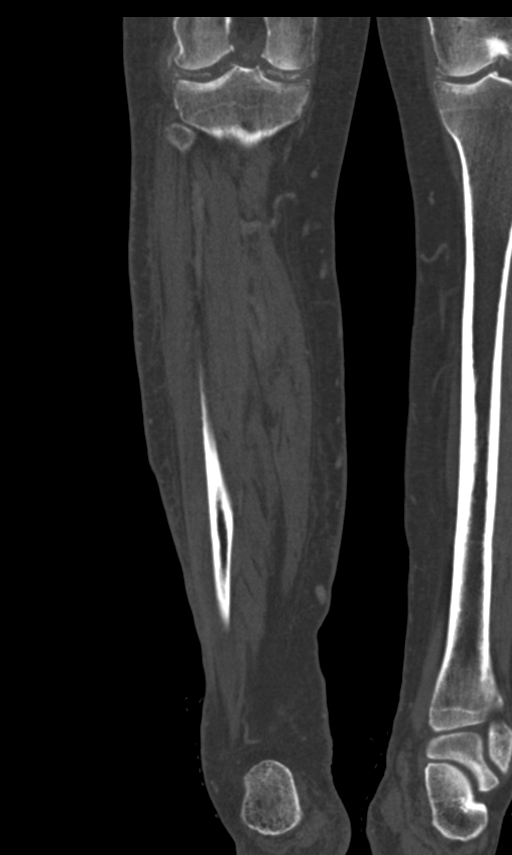

[Series 10: sagittal st · sagittal · 0.49mm/px · 5 of 84 slices shown, 6 images]
[im 28/84  bone]
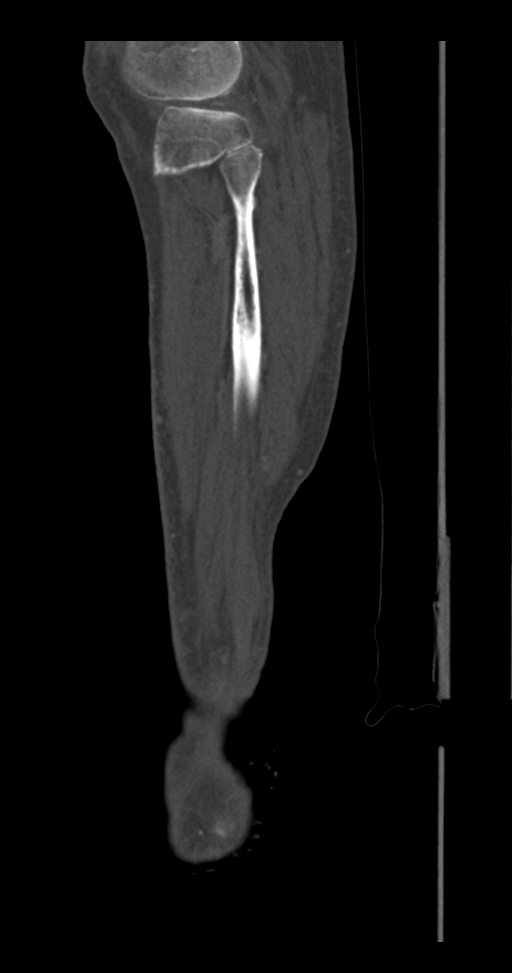
[im 35/84  bone]
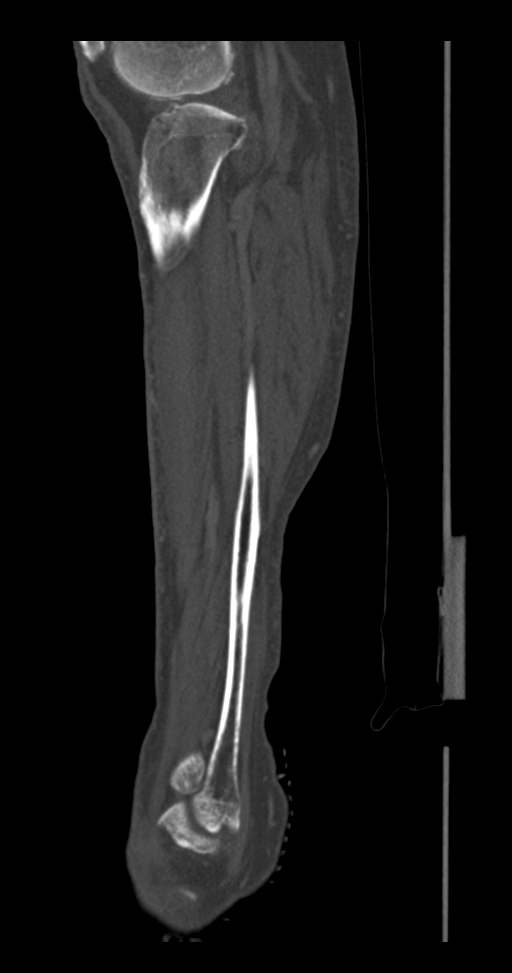
[im 42/84  soft-tissue]
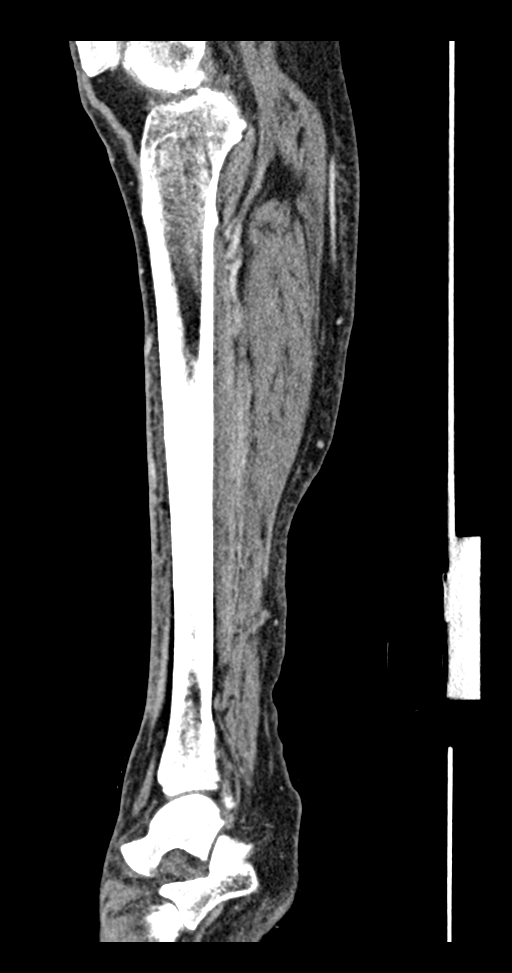
[im 42/84  bone]
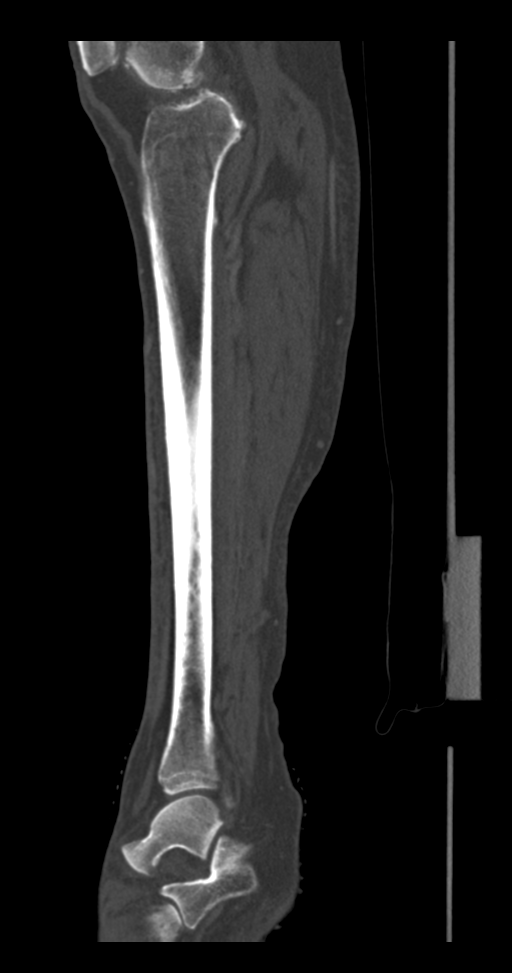
[im 49/84  bone]
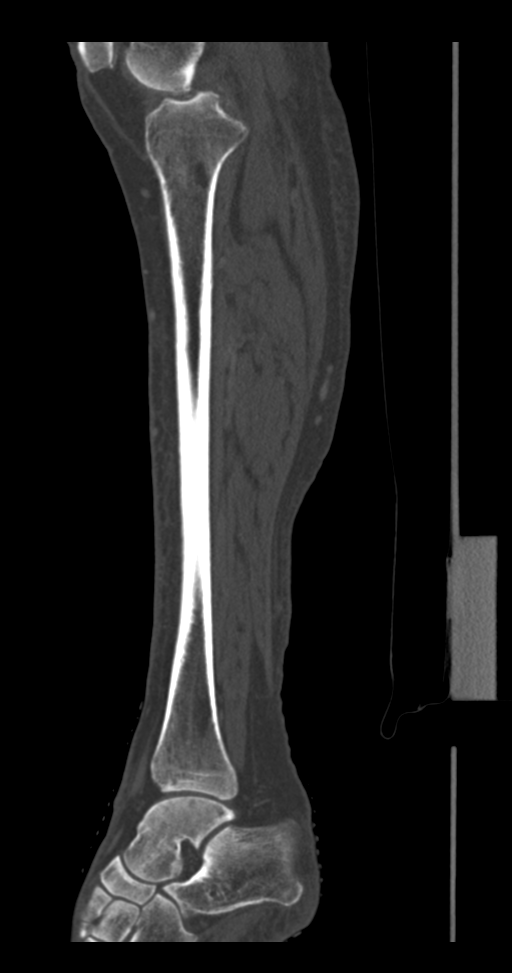
[im 56/84  bone]
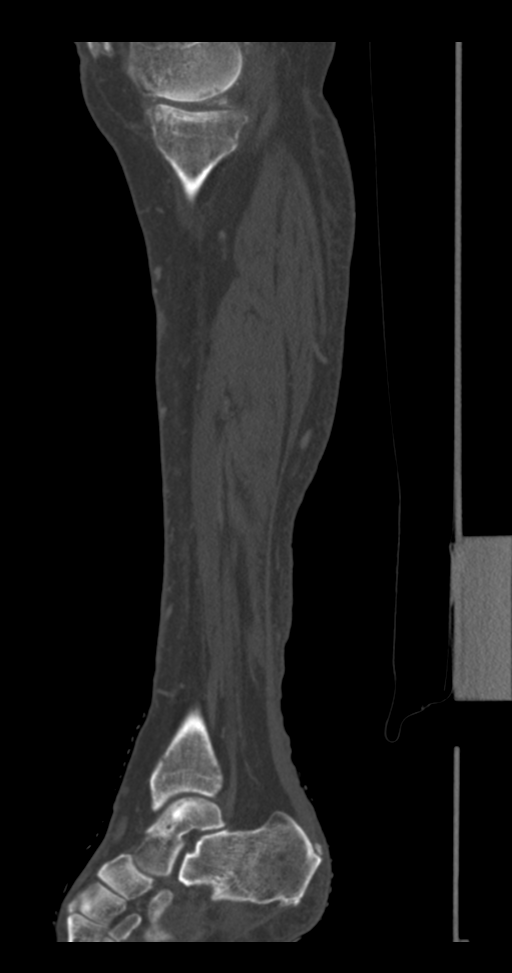

[10 of 33 positions shown; findings below may reference images not displayed]

FINDINGS: Bones/Joint/Cartilage

No acute bony or joint abnormality is identified. No bony
destructive change or periosteal reaction. Osteoarthritis about the
knee is noted. Chondrocalcinosis of the menisci is seen. No
worrisome bone lesion.

Ligaments

Suboptimally assessed by CT.

Muscles and Tendons

Intact. No atrophy or focal lesion. No intramuscular fluid
collection. No gas within muscle or tracking along fascial planes.

Soft tissues

There is a focal area of infiltration of subcutaneous fat along the
anterior aspect of the lower leg. The lesion is centered 14 cm below
the top of the fibular head and measures 1.3 cm AP x 3.1 cm
transverse x 4.1 cm craniocaudal. There is no rim enhancement or gas
within the collection. Density units measurements are 59. Mild
infiltration of subcutaneous fat about the distal aspect of the
lower leg is noted.
IMPRESSION: Focal subcutaneous collection in the anterior aspect of the lower
leg is likely a hematoma but could represent phlegmon. The
appearance is not typical for abscess.

Negative for acute bony or joint abnormality.

Osteoarthritis right knee.  Chondrocalcinosis right knee also noted.
